# Patient Record
Sex: Female | Born: 1970
Health system: Southern US, Community
[De-identification: ages and names within clinical notes are randomized; demographics above are authoritative.]

## PROBLEM LIST (undated history)

## (undated) DIAGNOSIS — T7840XA Allergy, unspecified, initial encounter: Secondary | ICD-10-CM

## (undated) DIAGNOSIS — G43909 Migraine, unspecified, not intractable, without status migrainosus: Secondary | ICD-10-CM

## (undated) DIAGNOSIS — L570 Actinic keratosis: Secondary | ICD-10-CM

## (undated) DIAGNOSIS — M2550 Pain in unspecified joint: Secondary | ICD-10-CM

## (undated) DIAGNOSIS — F419 Anxiety disorder, unspecified: Secondary | ICD-10-CM

## (undated) DIAGNOSIS — F32A Depression, unspecified: Secondary | ICD-10-CM

## (undated) DIAGNOSIS — E785 Hyperlipidemia, unspecified: Secondary | ICD-10-CM

## (undated) DIAGNOSIS — F329 Major depressive disorder, single episode, unspecified: Secondary | ICD-10-CM

## (undated) HISTORY — DX: Migraine, unspecified, not intractable, without status migrainosus: G43.909

## (undated) HISTORY — DX: Anxiety disorder, unspecified: F41.9

## (undated) HISTORY — DX: Depression, unspecified: F32.A

## (undated) HISTORY — PX: TUBAL LIGATION: SHX77

## (undated) HISTORY — DX: Major depressive disorder, single episode, unspecified: F32.9

## (undated) HISTORY — DX: Actinic keratosis: L57.0

## (undated) HISTORY — DX: Hyperlipidemia, unspecified: E78.5

## (undated) HISTORY — DX: Pain in unspecified joint: M25.50

## (undated) HISTORY — DX: Allergy, unspecified, initial encounter: T78.40XA

---

## 2006-05-11 ENCOUNTER — Ambulatory Visit: Payer: Self-pay

## 2008-10-24 ENCOUNTER — Ambulatory Visit: Payer: Self-pay

## 2009-12-06 HISTORY — PX: ENDOMETRIAL ABLATION: SHX621

## 2010-07-29 ENCOUNTER — Ambulatory Visit: Payer: Self-pay

## 2010-08-04 ENCOUNTER — Ambulatory Visit: Payer: Self-pay

## 2010-08-05 LAB — PATHOLOGY REPORT

## 2013-02-22 ENCOUNTER — Ambulatory Visit: Payer: Self-pay

## 2017-03-22 MED FILL — BUPROPION SR 150 MG TABLET: 150 | 30 days supply | Qty: 30 | Fill #0

## 2017-04-27 MED FILL — BUPROPION SR 150 MG TABLET: 150 | 30 days supply | Qty: 30 | Fill #1

## 2017-06-02 DIAGNOSIS — F4323 Adjustment disorder with mixed anxiety and depressed mood: Secondary | ICD-10-CM | POA: Diagnosis not present

## 2017-06-02 MED FILL — traZODone HCL 50 MG TABS: 50 | 90 days supply | Qty: 90 | Fill #0

## 2017-08-29 DIAGNOSIS — L239 Allergic contact dermatitis, unspecified cause: Secondary | ICD-10-CM | POA: Diagnosis not present

## 2017-08-31 DIAGNOSIS — L239 Allergic contact dermatitis, unspecified cause: Secondary | ICD-10-CM | POA: Diagnosis not present

## 2017-09-05 DIAGNOSIS — L239 Allergic contact dermatitis, unspecified cause: Secondary | ICD-10-CM | POA: Diagnosis not present

## 2017-09-19 ENCOUNTER — Ambulatory Visit: Payer: Self-pay | Admitting: Internal Medicine

## 2017-09-29 ENCOUNTER — Encounter: Payer: Self-pay | Admitting: Internal Medicine

## 2017-09-29 ENCOUNTER — Ambulatory Visit (INDEPENDENT_AMBULATORY_CARE_PROVIDER_SITE_OTHER): Payer: 59 | Admitting: Internal Medicine

## 2017-09-29 VITALS — BP 124/72 | HR 80 | Temp 98.5°F | Resp 14 | Ht 62.25 in | Wt 174.4 lb

## 2017-09-29 DIAGNOSIS — F330 Major depressive disorder, recurrent, mild: Secondary | ICD-10-CM

## 2017-09-29 DIAGNOSIS — H52223 Regular astigmatism, bilateral: Secondary | ICD-10-CM | POA: Diagnosis not present

## 2017-09-29 DIAGNOSIS — Z131 Encounter for screening for diabetes mellitus: Secondary | ICD-10-CM | POA: Diagnosis not present

## 2017-09-29 DIAGNOSIS — R5383 Other fatigue: Secondary | ICD-10-CM

## 2017-09-29 DIAGNOSIS — Z0000 Encounter for general adult medical examination without abnormal findings: Secondary | ICD-10-CM

## 2017-09-29 DIAGNOSIS — E669 Obesity, unspecified: Secondary | ICD-10-CM

## 2017-09-29 DIAGNOSIS — Z13220 Encounter for screening for lipoid disorders: Secondary | ICD-10-CM

## 2017-09-29 MED ORDER — BUPROPION HCL ER (SR) 100 MG PO TB12: 100.0000 mg | ORAL_TABLET | Freq: Two times a day (BID) | ORAL | 0 refills | Status: DC

## 2017-09-29 MED ORDER — BUPROPION HCL ER (XL) 150 MG PO TB24: 150.0000 mg | ORAL_TABLET | Freq: Every day | ORAL | 5 refills | Status: DC

## 2017-09-29 MED ORDER — TRAZODONE HCL 50 MG PO TABS: 25.0000 mg | ORAL_TABLET | Freq: Every evening | ORAL | 3 refills | Status: DC | PRN

## 2017-09-29 MED FILL — BUPROPION HCL SR 100 MG TAB: 100 | 30 days supply | Qty: 60 | Fill #0

## 2017-09-29 NOTE — Progress Notes (Signed)
Subjective:  Patient ID: Heather Pratt, female    DOB: [birth year]  Age: 46 y.o. MRN: [id number]  CC: The primary encounter diagnosis was Fatigue, unspecified type. Diagnoses of Screening for hyperlipidemia, Screening for diabetes mellitus, Mild episode of recurrent major depressive disorder ([place]), Obesity (BMI 30.0-34.9), and Encounter for preventive health examination were also pertinent to this visit.  HPI Heather Pratt presents for new patient evaluation and need for need for CPE . Referred by her mother Elmer Bales   Finished doctoral degree in PT  Working for [employer] in [place] .  Loves it    History of Major Depressive Disorder  diagnosed 10 yrs ago when her  father died. Thinks she has been depressed since high school but never acknowledged it.  Has a husband and two teenage boys,  Both in sports.  Works full time,  Husband shares the cooking .  Notes fatigue, Little energy after cooking  to do anything  Since she stopped medication in July .  Does more on the weekends, yoga and walking     History Donyel has a past medical history of Allergy and Depression.    History of c sections x 2 .  No prediabetes. Having regular periods ,  Tubal ligation.   Endometrial ablation in 2011 for menorrhagia    She has a past surgical history that includes Cesarean section with bilateral tubal ligation (005) and Endometrial ablation (2011).   Her family history includes Alcohol abuse in her father; Heart disease in her maternal grandfather and maternal grandmother; Hypertension in her father, maternal grandfather, maternal grandmother, and mother; Prostate cancer in her maternal grandfather.She reports that she has never smoked. She has never used smokeless tobacco. She reports that she drinks about 1.2 oz of alcohol per week . She reports that she does not use drugs.  No outpatient prescriptions prior to visit.   No facility-administered medications prior to visit.      Review of Systems:  Patient denies headache, fevers, malaise, unintentional weight loss, skin rash, eye pain, sinus congestion and sinus pain, sore throat, dysphagia,  hemoptysis , cough, dyspnea, wheezing, chest pain, palpitations, orthopnea, edema, abdominal pain, nausea, melena, diarrhea, constipation, flank pain, dysuria, hematuria, urinary  Frequency, nocturia, numbness, tingling, seizures,  Focal weakness, Loss of consciousness,  Tremor, insomnia, depression, anxiety, and suicidal ideation.     Objective:  BP 124/72 (BP Location: Left Arm, Patient Position: Sitting, Cuff Size: Normal)   Pulse 80   Temp 98.5 F (36.9 C) (Oral)   Resp 14   Ht 5' 2.25" (1.581 m)   Wt 174 lb 6.4 oz (79.1 kg)   SpO2 99%   BMI 31.64 kg/m   Physical Exam:  General appearance: alert, cooperative and appears stated age Ears: normal TM's and external ear canals both ears Throat: lips, mucosa, and tongue normal; teeth and gums normal Neck: no adenopathy, no carotid bruit, supple, symmetrical, trachea midline and thyroid not enlarged, symmetric, no tenderness/mass/nodules Back: symmetric, no curvature. ROM normal. No CVA tenderness. Lungs: clear to auscultation bilaterally Heart: regular rate and rhythm, S1, S2 normal, no murmur, click, rub or gallop Abdomen: soft, non-tender; bowel sounds normal; no masses,  no organomegaly Pulses: 2+ and symmetric Skin: Skin color, texture, turgor normal. No rashes or lesions Lymph nodes: Cervical, supraclavicular, and axillary nodes normal. Psych: affect normal, makes good eye contact. No fidgeting,  Smiles easily.  Denies suicidal thoughts   Assessment & Plan:   Problem  List Items Addressed This Visit    Encounter for preventive health examination    Annual comprehensive preventive exam was done as well as an evaluation and management of chronic conditions .  During the course of the visit the patient was educated and counseled about appropriate screening and  preventive services including :  diabetes screening, lipid analysis with projected  10 year  risk for CAD , nutrition counseling, breast, cervical and colorectal cancer screening, and recommended immunizations.  Printed recommendations for health maintenance screenings was given      Fatigue - Primary    Screening labs normal.  No history of snoring.  Recommended resuming wellbutrin , trial of trazodone for insomnia, and  participating in regular exercise program with goal of achieving a minimum of 30 minutes of aerobic activity 5 days per week.   Lab Results  Component Value Date   TSH 1.53 09/30/2017   Lab Results  Component Value Date   WBC 6.4 09/30/2017   HGB 13.4 09/30/2017   HCT 40.9 09/30/2017   MCV 92.3 09/30/2017   PLT 230.0 09/30/2017   Lab Results  Component Value Date   CREATININE 0.67 09/30/2017   Lab Results  Component Value Date   ALT 8 09/30/2017   AST 13 09/30/2017   ALKPHOS 39 09/30/2017   BILITOT 0.4 09/30/2017         Relevant Orders   Comprehensive metabolic panel (Completed)   TSH (Completed)   CBC with Differential/Platelet (Completed)   Major depressive disorder with current active episode    With insomnia, fatigue and lack of motivation scited as most pervasive symptoms, which returned after she stopped her wellbutrin in July .  Resume wellbutrin, trial of trazodone.        Relevant Medications   buPROPion (WELLBUTRIN SR) 100 MG 12 hr tablet   traZODone (DESYREL) 50 MG tablet   buPROPion (WELLBUTRIN XL) 150 MG 24 hr tablet   Obesity (BMI 30.0-34.9)    I have addressed  BMI and recommended wt loss of 10% of body weight over the next 6 months using a low fat, low starch, high protein  fruit/vegetable based Mediterranean diet and 30 minutes of aerobic exercise a minimum of 5 days per week.  Screening for diabetes, hyperlipidemia, and hypothyroidism today   All negative   Lab Results  Component Value Date   HGBA1C 5.5 09/30/2017   Lab Results    Component Value Date   CHOL 179 09/30/2017   HDL 51.00 09/30/2017   LDLCALC 111 (H) 09/30/2017   TRIG 84.0 09/30/2017   CHOLHDL 4 09/30/2017   Lab Results  Component Value Date   TSH 1.53 09/30/2017           Other Visit Diagnoses    Screening for hyperlipidemia       Relevant Orders   Lipid panel (Completed)   Screening for diabetes mellitus       Relevant Orders   Hemoglobin A1c (Completed)      I am having [employer] start on buPROPion, traZODone, and buPROPion.  Meds ordered this encounter  Medications  . buPROPion (WELLBUTRIN SR) 100 MG 12 hr tablet    Sig: Take 1 tablet (100 mg total) by mouth 2 (two) times daily.    Dispense:  60 tablet    Refill:  0  . traZODone (DESYREL) 50 MG tablet    Sig: Take 0.5-1 tablets (25-50 mg total) by mouth at bedtime as needed for sleep.  Dispense:  90 tablet    Refill:  3  . buPROPion (WELLBUTRIN XL) 150 MG 24 hr tablet    Sig: Take 1 tablet (150 mg total) by mouth daily.    Dispense:  30 tablet    Refill:  5    There are no discontinued medications.  Follow-up: Return in about 3 months (around 12/30/2017) for fasting labs asap .   Crecencio Mc, MD

## 2017-09-29 NOTE — Patient Instructions (Addendum)
Welcome!  So  Glad to meet you!  I agree  With resuming wellbutrin and trazodone (if needed)  Start wellbutrin with the 100 mg twice daily dose (2nd dose by 3 pm);  We will change to 150 mg once daily after the first month  (save the rx I gave you)    Health Maintenance, Female Adopting a healthy lifestyle and getting preventive care can go a long way to promote health and wellness. Talk with your health care provider about what schedule of regular examinations is right for you. This is a good chance for you to check in with your provider about disease prevention and staying healthy. In between checkups, there are plenty of things you can do on your own. Experts have done a lot of research about which lifestyle changes and preventive measures are most likely to keep you healthy. Ask your health care provider for more information. Weight and diet Eat a healthy diet  Be sure to include plenty of vegetables, fruits, low-fat dairy products, and lean protein.  Do not eat a lot of foods high in solid fats, added sugars, or salt.  Get regular exercise. This is one of the most important things you can do for your health. ? Most adults should exercise for at least 150 minutes each week. The exercise should increase your heart rate and make you sweat (moderate-intensity exercise). ? Most adults should also do strengthening exercises at least twice a week. This is in addition to the moderate-intensity exercise.  Maintain a healthy weight  Body mass index (BMI) is a measurement that can be used to identify possible weight problems. It estimates body fat based on height and weight. Your health care provider can help determine your BMI and help you achieve or maintain a healthy weight.  For females 71 years of age and older: ? A BMI below 18.5 is considered underweight. ? A BMI of 18.5 to 24.9 is normal. ? A BMI of 25 to 29.9 is considered overweight. ? A BMI of 30 and above is considered  obese.  Watch levels of cholesterol and blood lipids  You should start having your blood tested for lipids and cholesterol at 45 years of age, then have this test every 5 years.  You may need to have your cholesterol levels checked more often if: ? Your lipid or cholesterol levels are high. ? You are older than 45 years of age. ? You are at high risk for heart disease.  Cancer screening Lung Cancer  Lung cancer screening is recommended for adults 55-31 years old who are at high risk for lung cancer because of a history of smoking.  A yearly low-dose CT scan of the lungs is recommended for people who: ? Currently smoke. ? Have quit within the past 15 years. ? Have at least a 30-pack-year history of smoking. A pack year is smoking an average of one pack of cigarettes a day for 1 year.  Yearly screening should continue until it has been 15 years since you quit.  Yearly screening should stop if you develop a health problem that would prevent you from having lung cancer treatment.  Breast Cancer  Practice breast self-awareness. This means understanding how your breasts normally appear and feel.  It also means doing regular breast self-exams. Let your health care provider know about any changes, no matter how small.  If you are in your 20s or 30s, you should have a clinical breast exam (CBE) by a health care provider  every 1-3 years as part of a regular health exam.  If you are 41 or older, have a CBE every year. Also consider having a breast X-ray (mammogram) every year.  If you have a family history of breast cancer, talk to your health care provider about genetic screening.  If you are at high risk for breast cancer, talk to your health care provider about having an MRI and a mammogram every year.  Breast cancer gene (BRCA) assessment is recommended for women who have family members with BRCA-related cancers. BRCA-related cancers  include: ? Breast. ? Ovarian. ? Tubal. ? Peritoneal cancers.  Results of the assessment will determine the need for genetic counseling and BRCA1 and BRCA2 testing.  Cervical Cancer Your health care provider may recommend that you be screened regularly for cancer of the pelvic organs (ovaries, uterus, and vagina). This screening involves a pelvic examination, including checking for microscopic changes to the surface of your cervix (Pap test). You may be encouraged to have this screening done every 3 years, beginning at age 65.  For women ages 44-65, health care providers may recommend pelvic exams and Pap testing every 3 years, or they may recommend the Pap and pelvic exam, combined with testing for human papilloma virus (HPV), every 5 years. Some types of HPV increase your risk of cervical cancer. Testing for HPV may also be done on women of any age with unclear Pap test results.  Other health care providers may not recommend any screening for nonpregnant women who are considered low risk for pelvic cancer and who do not have symptoms. Ask your health care provider if a screening pelvic exam is right for you.  If you have had past treatment for cervical cancer or a condition that could lead to cancer, you need Pap tests and screening for cancer for at least 20 years after your treatment. If Pap tests have been discontinued, your risk factors (such as having a new sexual partner) need to be reassessed to determine if screening should resume. Some women have medical problems that increase the chance of getting cervical cancer. In these cases, your health care provider may recommend more frequent screening and Pap tests.  Colorectal Cancer  This type of cancer can be detected and often prevented.  Routine colorectal cancer screening usually begins at 45 years of age and continues through 45 years of age.  Your health care provider may recommend screening at an earlier age if you have risk factors  for colon cancer.  Your health care provider may also recommend using home test kits to check for hidden blood in the stool.  A small camera at the end of a tube can be used to examine your colon directly (sigmoidoscopy or colonoscopy). This is done to check for the earliest forms of colorectal cancer.  Routine screening usually begins at age 19.  Direct examination of the colon should be repeated every 5-10 years through 45 years of age. However, you may need to be screened more often if early forms of precancerous polyps or small growths are found.  Skin Cancer  Check your skin from head to toe regularly.  Tell your health care provider about any new moles or changes in moles, especially if there is a change in a mole's shape or color.  Also tell your health care provider if you have a mole that is larger than the size of a pencil eraser.  Always use sunscreen. Apply sunscreen liberally and repeatedly throughout the day.  Protect yourself by wearing long sleeves, pants, a wide-brimmed hat, and sunglasses whenever you are outside.  Heart disease, diabetes, and high blood pressure  High blood pressure causes heart disease and increases the risk of stroke. High blood pressure is more likely to develop in: ? People who have blood pressure in the high end of the normal range (130-139/85-89 mm Hg). ? People who are overweight or obese. ? People who are African American.  If you are 57-39 years of age, have your blood pressure checked every 3-5 years. If you are 19 years of age or older, have your blood pressure checked every year. You should have your blood pressure measured twice-once when you are at a hospital or clinic, and once when you are not at a hospital or clinic. Record the average of the two measurements. To check your blood pressure when you are not at a hospital or clinic, you can use: ? An automated blood pressure machine at a pharmacy. ? A home blood pressure monitor.  If  you are between 19 years and 38 years old, ask your health care provider if you should take aspirin to prevent strokes.  Have regular diabetes screenings. This involves taking a blood sample to check your fasting blood sugar level. ? If you are at a normal weight and have a low risk for diabetes, have this test once every three years after 45 years of age. ? If you are overweight and have a high risk for diabetes, consider being tested at a younger age or more often. Preventing infection Hepatitis B  If you have a higher risk for hepatitis B, you should be screened for this virus. You are considered at high risk for hepatitis B if: ? You were born in a country where hepatitis B is common. Ask your health care provider which countries are considered high risk. ? Your parents were born in a high-risk country, and you have not been immunized against hepatitis B (hepatitis B vaccine). ? You have HIV or AIDS. ? You use needles to inject street drugs. ? You live with someone who has hepatitis B. ? You have had sex with someone who has hepatitis B. ? You get hemodialysis treatment. ? You take certain medicines for conditions, including cancer, organ transplantation, and autoimmune conditions.  Hepatitis C  Blood testing is recommended for: ? Everyone born from [birth year] through 1964. ? Anyone with known risk factors for hepatitis C.  Sexually transmitted infections (STIs)  You should be screened for sexually transmitted infections (STIs) including gonorrhea and chlamydia if: ? You are sexually active and are younger than 45 years of age. ? You are older than 45 years of age and your health care provider tells you that you are at risk for this type of infection. ? Your sexual activity has changed since you were last screened and you are at an increased risk for chlamydia or gonorrhea. Ask your health care provider if you are at risk.  If you do not have HIV, but are at risk, it may be recommended  that you take a prescription medicine daily to prevent HIV infection. This is called pre-exposure prophylaxis (PrEP). You are considered at risk if: ? You are sexually active and do not regularly use condoms or know the HIV status of your partner(s). ? You take drugs by injection. ? You are sexually active with a partner who has HIV.  Talk with your health care provider about whether you are at high risk of  being infected with HIV. If you choose to begin PrEP, you should first be tested for HIV. You should then be tested every 3 months for as long as you are taking PrEP. Pregnancy  If you are premenopausal and you may become pregnant, ask your health care provider about preconception counseling.  If you may become pregnant, take 400 to 800 micrograms (mcg) of folic acid every day.  If you want to prevent pregnancy, talk to your health care provider about birth control (contraception). Osteoporosis and menopause  Osteoporosis is a disease in which the bones lose minerals and strength with aging. This can result in serious bone fractures. Your risk for osteoporosis can be identified using a bone density scan.  If you are 63 years of age or older, or if you are at risk for osteoporosis and fractures, ask your health care provider if you should be screened.  Ask your health care provider whether you should take a calcium or vitamin D supplement to lower your risk for osteoporosis.  Menopause may have certain physical symptoms and risks.  Hormone replacement therapy may reduce some of these symptoms and risks. Talk to your health care provider about whether hormone replacement therapy is right for you. Follow these instructions at home:  Schedule regular health, dental, and eye exams.  Stay current with your immunizations.  Do not use any tobacco products including cigarettes, chewing tobacco, or electronic cigarettes.  If you are pregnant, do not drink alcohol.  If you are  breastfeeding, limit how much and how often you drink alcohol.  Limit alcohol intake to no more than 1 drink per day for nonpregnant women. One drink equals 12 ounces of beer, 5 ounces of wine, or 1 ounces of hard liquor.  Do not use street drugs.  Do not share needles.  Ask your health care provider for help if you need support or information about quitting drugs.  Tell your health care provider if you often feel depressed.  Tell your health care provider if you have ever been abused or do not feel safe at home. This information is not intended to replace advice given to you by your health care provider. Make sure you discuss any questions you have with your health care provider. Document Released: 06/07/2011 Document Revised: 04/29/2016 Document Reviewed: 08/26/2015 Elsevier Interactive Patient Education  [employer].

## 2017-09-30 ENCOUNTER — Other Ambulatory Visit (INDEPENDENT_AMBULATORY_CARE_PROVIDER_SITE_OTHER): Payer: 59

## 2017-09-30 DIAGNOSIS — Z131 Encounter for screening for diabetes mellitus: Secondary | ICD-10-CM

## 2017-09-30 DIAGNOSIS — Z13220 Encounter for screening for lipoid disorders: Secondary | ICD-10-CM | POA: Diagnosis not present

## 2017-09-30 DIAGNOSIS — R5383 Other fatigue: Secondary | ICD-10-CM | POA: Diagnosis not present

## 2017-09-30 LAB — COMPREHENSIVE METABOLIC PANEL
ALK PHOS: 39 U/L (ref 39–117)
ALT: 8 U/L (ref 0–35)
AST: 13 U/L (ref 0–37)
Albumin: 3.8 g/dL (ref 3.5–5.2)
BILIRUBIN TOTAL: 0.4 mg/dL (ref 0.2–1.2)
BUN: 13 mg/dL (ref 6–23)
CO2: 26 meq/L (ref 19–32)
CREATININE: 0.67 mg/dL (ref 0.40–1.20)
Calcium: 9 mg/dL (ref 8.4–10.5)
Chloride: 107 mEq/L (ref 96–112)
GFR: 100.94 mL/min (ref >60.00)
GLUCOSE: 93 mg/dL (ref 70–99)
Potassium: 4.2 mEq/L (ref 3.5–5.1)
Sodium: 138 mEq/L (ref 135–145)
TOTAL PROTEIN: 6.9 g/dL (ref 6.0–8.3)

## 2017-09-30 LAB — HEMOGLOBIN A1C: HEMOGLOBIN A1C: 5.5 % (ref 4.6–6.5)

## 2017-09-30 LAB — LIPID PANEL
CHOLESTEROL: 179 mg/dL (ref 0–200)
HDL: 51 mg/dL (ref >39.00)
LDL Cholesterol: 111 mg/dL — ABNORMAL HIGH (ref 0–99)
NONHDL: 127.82
Total CHOL/HDL Ratio: 4
Triglycerides: 84 mg/dL (ref 0.0–149.0)
VLDL: 16.8 mg/dL (ref 0.0–40.0)

## 2017-09-30 LAB — CBC WITH DIFFERENTIAL/PLATELET
BASOS PCT: 0.8 % (ref 0.0–3.0)
Basophils Absolute: 0.1 10*3/uL (ref 0.0–0.1)
Eosinophils Absolute: 0.3 10*3/uL (ref 0.0–0.7)
Eosinophils Relative: 5.1 % — ABNORMAL HIGH (ref 0.0–5.0)
HEMATOCRIT: 40.9 % (ref 36.0–46.0)
HEMOGLOBIN: 13.4 g/dL (ref 12.0–15.0)
LYMPHS ABS: 2 10*3/uL (ref 0.7–4.0)
Lymphocytes Relative: 30.4 % (ref 12.0–46.0)
MCHC: 32.7 g/dL (ref 30.0–36.0)
MCV: 92.3 fl (ref 78.0–100.0)
MONO ABS: 0.6 10*3/uL (ref 0.1–1.0)
Monocytes Relative: 9.6 % (ref 3.0–12.0)
NEUTROS ABS: 3.5 10*3/uL (ref 1.4–7.7)
Neutrophils Relative %: 54.1 % (ref 43.0–77.0)
Platelets: 230 10*3/uL (ref 150.0–400.0)
RBC: 4.44 Mil/uL (ref 3.87–5.11)
RDW: 13.7 % (ref 11.5–15.5)
WBC: 6.4 10*3/uL (ref 4.0–10.5)

## 2017-09-30 LAB — TSH: TSH: 1.53 u[IU]/mL (ref 0.35–4.50)

## 2017-10-01 DIAGNOSIS — F329 Major depressive disorder, single episode, unspecified: Secondary | ICD-10-CM | POA: Insufficient documentation

## 2017-10-01 DIAGNOSIS — R5383 Other fatigue: Secondary | ICD-10-CM | POA: Insufficient documentation

## 2017-10-01 DIAGNOSIS — E669 Obesity, unspecified: Secondary | ICD-10-CM | POA: Insufficient documentation

## 2017-10-01 DIAGNOSIS — Z0000 Encounter for general adult medical examination without abnormal findings: Secondary | ICD-10-CM | POA: Insufficient documentation

## 2017-10-01 NOTE — Assessment & Plan Note (Signed)
Annual comprehensive preventive exam was done as well as an evaluation and management of chronic conditions .  During the course of the visit the patient was educated and counseled about appropriate screening and preventive services including :  diabetes screening, lipid analysis with projected  10 year  risk for CAD , nutrition counseling, breast, cervical and colorectal cancer screening, and recommended immunizations.  Printed recommendations for health maintenance screenings was given

## 2017-10-01 NOTE — Assessment & Plan Note (Addendum)
With insomnia, fatigue and lack of motivation scited as most pervasive symptoms, which returned after she stopped her wellbutrin in July .  Resume wellbutrin, trial of trazodone.

## 2017-10-01 NOTE — Assessment & Plan Note (Signed)
Screening labs normal.  No history of snoring.  Recommended resuming wellbutrin , trial of trazodone for insomnia, and  participating in regular exercise program with goal of achieving a minimum of 30 minutes of aerobic activity 5 days per week.   Lab Results  Component Value Date   TSH 1.53 09/30/2017   Lab Results  Component Value Date   WBC 6.4 09/30/2017   HGB 13.4 09/30/2017   HCT 40.9 09/30/2017   MCV 92.3 09/30/2017   PLT 230.0 09/30/2017   Lab Results  Component Value Date   CREATININE 0.67 09/30/2017   Lab Results  Component Value Date   ALT 8 09/30/2017   AST 13 09/30/2017   ALKPHOS 39 09/30/2017   BILITOT 0.4 09/30/2017

## 2017-10-01 NOTE — Assessment & Plan Note (Signed)
I have addressed  BMI and recommended wt loss of 10% of body weight over the next 6 months using a low fat, low starch, high protein  fruit/vegetable based Mediterranean diet and 30 minutes of aerobic exercise a minimum of 5 days per week.  Screening for diabetes, hyperlipidemia, and hypothyroidism today   All negative   Lab Results  Component Value Date   HGBA1C 5.5 09/30/2017   Lab Results  Component Value Date   CHOL 179 09/30/2017   HDL 51.00 09/30/2017   LDLCALC 111 (H) 09/30/2017   TRIG 84.0 09/30/2017   CHOLHDL 4 09/30/2017   Lab Results  Component Value Date   TSH 1.53 09/30/2017

## 2017-10-18 DIAGNOSIS — Z1231 Encounter for screening mammogram for malignant neoplasm of breast: Secondary | ICD-10-CM | POA: Diagnosis not present

## 2017-10-18 DIAGNOSIS — Z6831 Body mass index (BMI) 31.0-31.9, adult: Secondary | ICD-10-CM | POA: Diagnosis not present

## 2017-10-18 DIAGNOSIS — Z1151 Encounter for screening for human papillomavirus (HPV): Secondary | ICD-10-CM | POA: Diagnosis not present

## 2017-10-18 DIAGNOSIS — Z01419 Encounter for gynecological examination (general) (routine) without abnormal findings: Secondary | ICD-10-CM | POA: Diagnosis not present

## 2017-10-18 LAB — HM PAP SMEAR: HM PAP: NEGATIVE

## 2017-10-25 MED FILL — BUPROPION HCL XL 150 MG TAB: 150 | 30 days supply | Qty: 30 | Fill #0

## 2017-11-24 MED FILL — BUPROPION HCL XL 150 MG TAB: 150 | 30 days supply | Qty: 30 | Fill #1

## 2017-12-28 MED FILL — buPROPion HCL ER (XL) 150 M: 150 | 30 days supply | Qty: 30 | Fill #2

## 2017-12-30 ENCOUNTER — Ambulatory Visit: Payer: 59 | Admitting: Internal Medicine

## 2018-01-02 ENCOUNTER — Encounter: Payer: Self-pay | Admitting: Internal Medicine

## 2018-01-02 ENCOUNTER — Ambulatory Visit (INDEPENDENT_AMBULATORY_CARE_PROVIDER_SITE_OTHER): Payer: 59 | Admitting: Internal Medicine

## 2018-01-02 DIAGNOSIS — F330 Major depressive disorder, recurrent, mild: Secondary | ICD-10-CM

## 2018-01-02 MED ORDER — OSELTAMIVIR PHOSPHATE 75 MG PO CAPS: 75.0000 mg | ORAL_CAPSULE | Freq: Every day | ORAL | 0 refills | Status: DC

## 2018-01-02 MED ORDER — BUPROPION HCL ER (XL) 300 MG PO TB24: 300.0000 mg | ORAL_TABLET | Freq: Every day | ORAL | 2 refills | Status: DC

## 2018-01-02 NOTE — Progress Notes (Signed)
Subjective:  Patient ID: Heather Pratt, female    DOB: [birth year]  Age: 47 y.o. MRN: [id number]  CC: The encounter diagnosis was Mild episode of recurrent major depressive disorder ([place]).  HPI Heather Pratt presents for follow up on fatigue attibuted to depression .  wellbutrin and trazodone prescRibed,   Exercise recommended   Medication is helping but still averaging one episode  Of despair  about every 2 weeks.  Energy level has improved. She has enough energy to start going for walks   DAILY  Her beloved dog of 12 years died over the holidays   Outpatient Medications Prior to Visit  Medication Sig Dispense Refill  . traZODone (DESYREL) 50 MG tablet Take 0.5-1 tablets (25-50 mg total) by mouth at bedtime as needed for sleep. 90 tablet 3  . buPROPion (WELLBUTRIN XL) 150 MG 24 hr tablet Take 1 tablet (150 mg total) by mouth daily. 30 tablet 5  . buPROPion (WELLBUTRIN SR) 100 MG 12 hr tablet Take 1 tablet (100 mg total) by mouth 2 (two) times daily. (Patient not taking: Reported on 01/02/2018) 60 tablet 0   No facility-administered medications prior to visit.     Review of Systems;  Patient denies headache, fevers, malaise, unintentional weight loss, skin rash, eye pain, sinus congestion and sinus pain, sore throat, dysphagia,  hemoptysis , cough, dyspnea, wheezing, chest pain, palpitations, orthopnea, edema, abdominal pain, nausea, melena, diarrhea, constipation, flank pain, dysuria, hematuria, urinary  Frequency, nocturia, numbness, tingling, seizures,  Focal weakness, Loss of consciousness,  Tremor, insomnia, depression, anxiety, and suicidal ideation.      Objective:  BP 126/70 (BP Location: Left Arm, Patient Position: Sitting, Cuff Size: Normal)   Pulse 78   Temp 97.9 F (36.6 C) (Oral)   Resp 15   Ht 5' 2.25" (1.581 m)   Wt 177 lb 9.6 oz (80.6 kg)   SpO2 98%   BMI 32.22 kg/m   BP Readings from Last 3 Encounters:  01/02/18 126/70  09/29/17 124/72     Wt Readings from Last 3 Encounters:  01/02/18 177 lb 9.6 oz (80.6 kg)  09/29/17 174 lb 6.4 oz (79.1 kg)    General appearance: alert, cooperative and appears stated age Ears: normal TM's and external ear canals both ears Throat: lips, mucosa, and tongue normal; teeth and gums normal Neck: no adenopathy, no carotid bruit, supple, symmetrical, trachea midline and thyroid not enlarged, symmetric, no tenderness/mass/nodules Back: symmetric, no curvature. ROM normal. No CVA tenderness. Lungs: clear to auscultation bilaterally Heart: regular rate and rhythm, S1, S2 normal, no murmur, click, rub or gallop Abdomen: soft, non-tender; bowel sounds normal; no masses,  no organomegaly Pulses: 2+ and symmetric Skin: Skin color, texture, turgor normal. No rashes or lesions Lymph nodes: Cervical, supraclavicular, and axillary nodes normal.  Lab Results  Component Value Date   HGBA1C 5.5 09/30/2017    Lab Results  Component Value Date   CREATININE 0.67 09/30/2017    Lab Results  Component Value Date   WBC 6.4 09/30/2017   HGB 13.4 09/30/2017   HCT 40.9 09/30/2017   PLT 230.0 09/30/2017   GLUCOSE 93 09/30/2017   CHOL 179 09/30/2017   TRIG 84.0 09/30/2017   HDL 51.00 09/30/2017   LDLCALC 111 (H) 09/30/2017   ALT 8 09/30/2017   AST 13 09/30/2017   NA 138 09/30/2017   K 4.2 09/30/2017   CL 107 09/30/2017   CREATININE 0.67 09/30/2017   BUN 13 09/30/2017   CO2  26 09/30/2017   TSH 1.53 09/30/2017   HGBA1C 5.5 09/30/2017    Assessment & Plan:   Problem List Items Addressed This Visit    Major depressive disorder with current active episode    Symptoms not currently adequately controlled on curent dose of wellbutrin .  Increasing wellbutrin to 300 mg daily .        Relevant Medications   buPROPion (WELLBUTRIN XL) 300 MG 24 hr tablet    A total of 21mnutes of face to face time was spent with patient more than half of which was spent in counselling and coordination of care    I am having Avyn B. VWhole Foodsstart on oseltamivir. I am also having her maintain her traZODone and buPROPion.  Meds ordered this encounter  Medications  . DISCONTD: buPROPion (WELLBUTRIN XL) 300 MG 24 hr tablet    Sig: Take 1 tablet (300 mg total) by mouth daily.    Dispense:  90 tablet    Refill:  2  . oseltamivir (TAMIFLU) 75 MG capsule    Sig: Take 1 capsule (75 mg total) by mouth daily.    Dispense:  10 capsule    Refill:  0    KEEP ON FILE FOR FUTURE USE  . buPROPion (WELLBUTRIN XL) 300 MG 24 hr tablet    Sig: Take 1 tablet (300 mg total) by mouth daily.    Dispense:  90 tablet    Refill:  2    Medications Discontinued During This Encounter  Medication Reason  . buPROPion (WELLBUTRIN SR) 100 MG 12 hr tablet   . buPROPion (WELLBUTRIN XL) 150 MG 24 hr tablet   . buPROPion (WELLBUTRIN XL) 300 MG 24 hr tablet Reorder    Follow-up: Return in about 6 months (around 07/02/2018) for depression.   TCrecencio Mc MD

## 2018-01-02 NOTE — Patient Instructions (Signed)
WE INCREASED THE WELLBUTRIN DOSE TO 300 MG DAILY  IF YOU FEEL IT IS TOO STRONG,  WE CAN REDUCE THE DOSE AND ADD A 10 MG DOSE OF LEXAPRO  USE YOUR MYCHART TO LET ME KNOW HOW YOU ARE DOING IN A FEW WEEKS

## 2018-01-03 NOTE — Assessment & Plan Note (Addendum)
Symptoms not currently adequately controlled on curent dose of wellbutrin .  Increasing wellbutrin to 300 mg daily .

## 2018-01-16 MED FILL — BUPROPION HCL XL 300 MG TAB: 300 | 90 days supply | Qty: 90 | Fill #0

## 2018-01-30 MED FILL — CHLORHEXIDINE 0.12% RINSE: 0.12 | 16 days supply | Qty: 473 | Fill #0

## 2018-01-30 MED FILL — AZITHROMYCIN 500 MG TABLET: 500 | 3 days supply | Qty: 3 | Fill #0

## 2018-04-10 MED FILL — BUPROPION HCL XL 300 MG TAB: 300 | 90 days supply | Qty: 90 | Fill #1

## 2018-07-20 MED FILL — BUPROPION HCL XL 300 MG TAB: 300 | 90 days supply | Qty: 90 | Fill #2

## 2018-07-24 MED FILL — AZITHROMYCIN 250 MG TABLET: 250 | 1 days supply | Qty: 2 | Fill #0

## 2018-09-12 ENCOUNTER — Telehealth: Payer: Self-pay
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
## 2018-09-13 ENCOUNTER — Encounter: Payer: Self-pay | Admitting: Internal Medicine

## 2018-09-13 ENCOUNTER — Ambulatory Visit (INDEPENDENT_AMBULATORY_CARE_PROVIDER_SITE_OTHER): Payer: 59 | Admitting: Internal Medicine

## 2018-09-13 VITALS — BP 108/72 | HR 85 | Temp 98.0°F | Resp 15 | Ht 62.25 in | Wt 182.0 lb

## 2018-09-13 DIAGNOSIS — M545 Low back pain, unspecified: Secondary | ICD-10-CM

## 2018-09-13 DIAGNOSIS — M62830 Muscle spasm of back: Secondary | ICD-10-CM | POA: Diagnosis not present

## 2018-09-13 MED ORDER — PANTOPRAZOLE SODIUM 40 MG PO TBEC: 40.0000 mg | DELAYED_RELEASE_TABLET | Freq: Every day | ORAL | 3 refills | Status: DC

## 2018-09-13 MED ORDER — TIZANIDINE HCL 4 MG PO CAPS: 4.0000 mg | ORAL_CAPSULE | Freq: Three times a day (TID) | ORAL | 0 refills | Status: DC | PRN

## 2018-09-13 MED FILL — PANTOPRAZOLE SOD DR 40 MG T: 40 | 30 days supply | Qty: 30 | Fill #0
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
## 2018-09-13 NOTE — Patient Instructions (Signed)
I recommend using ibuprofen 800 mg every 8 hours for the next 5-6 days and ice for 15 minutes every 4 to 6 hours    Pantoprazole once daily for GI protection   You can add up to 2000 mg of acetominophen (tylenol) every day safely  In divided doses (500 mg every 6 hours  Or 1000 mg every 12 hours.)  The zanaflex will be on file at the pharmacy if you need it

## 2018-09-13 NOTE — Telephone Encounter (Signed)
Pt was seen in the office today.

## 2018-09-16 DIAGNOSIS — M62830 Muscle spasm of back: Secondary | ICD-10-CM | POA: Insufficient documentation
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
## 2018-10-17 ENCOUNTER — Other Ambulatory Visit: Payer: Self-pay | Admitting: Internal Medicine

## 2018-10-17 MED FILL — BuPROPion HCL ER (XL) 300 M: 300 | 90 days supply | Qty: 90 | Fill #0

## 2018-10-27 DIAGNOSIS — Z01419 Encounter for gynecological examination (general) (routine) without abnormal findings: Secondary | ICD-10-CM | POA: Diagnosis not present

## 2018-10-27 DIAGNOSIS — Z1151 Encounter for screening for human papillomavirus (HPV): Secondary | ICD-10-CM | POA: Diagnosis not present

## 2018-10-27 DIAGNOSIS — Z1231 Encounter for screening mammogram for malignant neoplasm of breast: Secondary | ICD-10-CM | POA: Diagnosis not present

## 2018-10-27 DIAGNOSIS — Z6832 Body mass index (BMI) 32.0-32.9, adult: Secondary | ICD-10-CM | POA: Diagnosis not present

## 2018-10-27 MED FILL — ESTRADIOL 0.1 MG/DAY PATCH: 0.1 | 84 days supply | Qty: 4 | Fill #0

## 2018-11-24 ENCOUNTER — Encounter: Payer: Self-pay | Admitting: Internal Medicine

## 2018-11-24 ENCOUNTER — Ambulatory Visit (INDEPENDENT_AMBULATORY_CARE_PROVIDER_SITE_OTHER): Payer: 59 | Admitting: Internal Medicine

## 2018-11-24 VITALS — BP 136/82 | HR 79 | Temp 98.1°F | Resp 14 | Ht 62.25 in | Wt 176.4 lb

## 2018-11-24 DIAGNOSIS — E559 Vitamin D deficiency, unspecified: Secondary | ICD-10-CM

## 2018-11-24 DIAGNOSIS — N920 Excessive and frequent menstruation with regular cycle: Secondary | ICD-10-CM

## 2018-11-24 DIAGNOSIS — E66811 Obesity, class 1: Secondary | ICD-10-CM

## 2018-11-24 DIAGNOSIS — F330 Major depressive disorder, recurrent, mild: Secondary | ICD-10-CM

## 2018-11-24 DIAGNOSIS — F418 Other specified anxiety disorders: Secondary | ICD-10-CM

## 2018-11-24 DIAGNOSIS — R5383 Other fatigue: Secondary | ICD-10-CM

## 2018-11-24 DIAGNOSIS — E669 Obesity, unspecified: Secondary | ICD-10-CM

## 2018-11-24 NOTE — Patient Instructions (Signed)
Good to see you!  I will send you your labs results in a few days    I offered you a medication called Buspar,  To help manage short term anxiety .  If you would like to try it,  Just let me know via mychart.  Buspirone tablets What is this medicine? BUSPIRONE (byoo SPYE rone) is used to treat anxiety disorders. This medicine may be used for other purposes; ask your health care provider or pharmacist if you have questions. COMMON BRAND NAME(S): BuSpar What should I tell my health care provider before I take this medicine? They need to know if you have any of these conditions: -kidney or liver disease -an unusual or allergic reaction to buspirone, other medicines, foods, dyes, or preservatives -pregnant or trying to get pregnant -breast-feeding How should I use this medicine? Take this medicine by mouth with a glass of water. Follow the directions on the prescription label. You may take this medicine with or without food. To ensure that this medicine always works the same way for you, you should take it either always with or always without food. Take your doses at regular intervals. Do not take your medicine more often than directed. Do not stop taking except on the advice of your doctor or health care professional. Talk to your pediatrician regarding the use of this medicine in children. Special care may be needed. Overdosage: If you think you have taken too much of this medicine contact a poison control center or emergency room at once. NOTE: This medicine is only for you. Do not share this medicine with others. What if I miss a dose? If you miss a dose, take it as soon as you can. If it is almost time for your next dose, take only that dose. Do not take double or extra doses. What may interact with this medicine? Do not take this medicine with any of the following medications: -linezolid -MAOIs like Carbex, Eldepryl, Marplan, Nardil, and Parnate -methylene blue -procarbazine This  medicine may also interact with the following medications: -diazepam -digoxin -diltiazem -erythromycin -grapefruit juice -haloperidol -medicines for mental depression or mood problems -medicines for seizures like carbamazepine, phenobarbital and phenytoin -nefazodone -other medications for anxiety -rifampin -ritonavir -some antifungal medicines like itraconazole, ketoconazole, and voriconazole -verapamil -warfarin This list may not describe all possible interactions. Give your health care provider a list of all the medicines, herbs, non-prescription drugs, or dietary supplements you use. Also tell them if you smoke, drink alcohol, or use illegal drugs. Some items may interact with your medicine. What should I watch for while using this medicine? Visit your doctor or health care professional for regular checks on your progress. It may take 1 to 2 weeks before your anxiety gets better. You may get drowsy or dizzy. Do not drive, use machinery, or do anything that needs mental alertness until you know how this drug affects you. Do not stand or sit up quickly, especially if you are an older patient. This reduces the risk of dizzy or fainting spells. Alcohol can make you more drowsy and dizzy. Avoid alcoholic drinks. What side effects may I notice from receiving this medicine? Side effects that you should report to your doctor or health care professional as soon as possible: -blurred vision or other vision changes -chest pain -confusion -difficulty breathing -feelings of hostility or anger -muscle aches and pains -numbness or tingling in hands or feet -ringing in the ears -skin rash and itching -vomiting -weakness Side effects that usually do  not require medical attention (report to your doctor or health care professional if they continue or are bothersome): -disturbed dreams, nightmares -headache -nausea -restlessness or nervousness -sore throat and nasal congestion -stomach  upset This list may not describe all possible side effects. Call your doctor for medical advice about side effects. You may report side effects to FDA at 1-800-FDA-1088. Where should I keep my medicine? Keep out of the reach of children. Store at room temperature below 30 degrees C (86 degrees F). Protect from light. Keep container tightly closed. Throw away any unused medicine after the expiration date. NOTE: This sheet is a summary. It may not cover all possible information. If you have questions about this medicine, talk to your doctor, pharmacist, or health care provider.  2019 Elsevier/Gold Standard (2010-07-02 18:06:11)

## 2018-11-24 NOTE — Progress Notes (Signed)
Subjective:  Patient ID: Heather Pratt, female    DOB: May 12, 1971  Age: 47 y.o. MRN: [id number]  CC: The primary encounter diagnosis was Obesity (BMI 30.0-34.9). Diagnoses of Fatigue, unspecified type, Menorrhagia with regular cycle, Vitamin D deficiency, Mild episode of recurrent major depressive disorder ([place]), and Anxiety associated with depression were also pertinent to this visit.  HPI Heather Pratt presents for   Follow up on depression  Emotional stressors  are worse  this time of year.  She is reticent to discuss the source. She is not sleeping well.  trazdone with no side effects  1/2 tablet dose.  She is workng 40+ hours per week    Outpatient Medications Prior to Visit  Medication Sig Dispense Refill  . buPROPion (WELLBUTRIN XL) 300 MG 24 hr tablet TAKE 1 TABLET BY MOUTH DAILY. 90 tablet 2  . traZODone (DESYREL) 50 MG tablet Take 0.5-1 tablets (25-50 mg total) by mouth at bedtime as needed for sleep. 90 tablet 3  . oseltamivir (TAMIFLU) 75 MG capsule Take 1 capsule (75 mg total) by mouth daily. (Patient not taking: Reported on 09/13/2018) 10 capsule 0  . pantoprazole (PROTONIX) 40 MG tablet Take 1 tablet (40 mg total) by mouth daily. (Patient not taking: Reported on 11/24/2018) 30 tablet 3  . tiZANidine (ZANAFLEX) 4 MG capsule Take 1 capsule (4 mg total) by mouth 3 (three) times daily as needed for muscle spasms. (Patient not taking: Reported on 11/24/2018) 30 capsule 0   No facility-administered medications prior to visit.     Review of Systems;  Patient denies headache, fevers, malaise, unintentional weight loss, skin rash, eye pain, sinus congestion and sinus pain, sore throat, dysphagia,  hemoptysis , cough, dyspnea, wheezing, chest pain, palpitations, orthopnea, edema, abdominal pain, nausea, melena, diarrhea, constipation, flank pain, dysuria, hematuria, urinary  Frequency, nocturia, numbness, tingling, seizures,  Focal weakness, Loss of consciousness,   Tremor, insomnia, depression, anxiety, and suicidal ideation.      Objective:  BP 136/82 (BP Location: Left Arm, Patient Position: Sitting, Cuff Size: Normal)   Pulse 79   Temp 98.1 F (36.7 C) (Oral)   Resp 14   Ht 5' 2.25" (1.581 m)   Wt 176 lb 6.4 oz (80 kg)   SpO2 98%   BMI 32.01 kg/m   BP Readings from Last 3 Encounters:  11/24/18 136/82  09/13/18 108/72  01/02/18 126/70    Wt Readings from Last 3 Encounters:  11/24/18 176 lb 6.4 oz (80 kg)  09/13/18 182 lb (82.6 kg)  01/02/18 177 lb 9.6 oz (80.6 kg)    General appearance: alert, cooperative and appears stated age Ears: normal TM's and external ear canals both ears Throat: lips, mucosa, and tongue normal; teeth and gums normal Neck: no adenopathy, no carotid bruit, supple, symmetrical, trachea midline and thyroid not enlarged, symmetric, no tenderness/mass/nodules Back: symmetric, no curvature. ROM normal. No CVA tenderness. Lungs: clear to auscultation bilaterally Heart: regular rate and rhythm, S1, S2 normal, no murmur, click, rub or gallop Abdomen: soft, non-tender; bowel sounds normal; no masses,  no organomegaly Pulses: 2+ and symmetric Skin: Skin color, texture, turgor normal. No rashes or lesions Lymph nodes: Cervical, supraclavicular, and axillary nodes normal.  Lab Results  Component Value Date   HGBA1C 5.1 11/24/2018   HGBA1C 5.5 09/30/2017    Lab Results  Component Value Date   CREATININE 0.75 11/24/2018   CREATININE 0.67 09/30/2017    Lab Results  Component Value Date   WBC  7.6 11/24/2018   HGB 13.2 11/24/2018   HCT 37.2 11/24/2018   PLT 271 11/24/2018   GLUCOSE 80 11/24/2018   CHOL 179 09/30/2017   TRIG 84.0 09/30/2017   HDL 51.00 09/30/2017   LDLCALC 111 (H) 09/30/2017   ALT 11 11/24/2018   AST 16 11/24/2018   NA 137 11/24/2018   K 3.7 11/24/2018   CL 104 11/24/2018   CREATININE 0.75 11/24/2018   BUN 12 11/24/2018   CO2 22 11/24/2018   TSH 1.43 11/24/2018   HGBA1C 5.1  11/24/2018     Assessment & Plan:   Problem List Items Addressed This Visit    Anxiety associated with depression    Adding buspirone twice daily for management of anxiety       Fatigue   Relevant Orders   Comprehensive metabolic panel (Completed)   B12 and Folate Panel (Completed)   TSH (Completed)   Major depressive disorder with current active episode    Continue trazodone at bedtime as needed for anxiety       Obesity (BMI 30.0-34.9) - Primary   Relevant Orders   Hemoglobin A1c (Completed)   Insulin, Free (Bioactive)    Other Visit Diagnoses    Menorrhagia with regular cycle       Relevant Orders   CBC with Differential/Platelet (Completed)   Iron, TIBC and Ferritin Panel (Completed)   Vitamin D deficiency       Relevant Orders   VITAMIN D 25 Hydroxy (Vit-D Deficiency, Fractures) (Completed)    A total of 25 minutes of face to face time was spent with patient more than half of which was spent in counselling about the above mentioned conditions  and coordination of care   I have discontinued Benjamine Mola B. Lucianne Lei Pratt's oseltamivir, tiZANidine, and pantoprazole. I am also having her maintain her traZODone and buPROPion.  No orders of the defined types were placed in this encounter.   Medications Discontinued During This Encounter  Medication Reason  . oseltamivir (TAMIFLU) 75 MG capsule   . pantoprazole (PROTONIX) 40 MG tablet Patient has not taken in last 30 days  . tiZANidine (ZANAFLEX) 4 MG capsule Patient has not taken in last 30 days    Follow-up: No follow-ups on file.   Crecencio Mc, MD

## 2018-11-25 LAB — IRON,TIBC AND FERRITIN PANEL
%SAT: 27 % (calc) (ref 16–45)
Ferritin: 41 ng/mL (ref 16–232)
Iron: 75 ug/dL (ref 40–190)
TIBC: 283 ug/dL (ref 250–450)

## 2018-11-25 LAB — CBC WITH DIFFERENTIAL/PLATELET
Absolute Monocytes: 760 cells/uL (ref 200–950)
BASOS ABS: 38 {cells}/uL (ref 0–200)
Basophils Relative: 0.5 %
EOS ABS: 190 {cells}/uL (ref 15–500)
Eosinophils Relative: 2.5 %
HEMATOCRIT: 37.2 % (ref 35.0–45.0)
Hemoglobin: 13.2 g/dL (ref 11.7–15.5)
LYMPHS ABS: 2044 {cells}/uL (ref 850–3900)
MCH: 31 pg (ref 27.0–33.0)
MCHC: 35.5 g/dL (ref 32.0–36.0)
MCV: 87.3 fL (ref 80.0–100.0)
MPV: 11.7 fL (ref 7.5–12.5)
Monocytes Relative: 10 %
NEUTROS PCT: 60.1 %
Neutro Abs: 4568 cells/uL (ref 1500–7800)
Platelets: 271 10*3/uL (ref 140–400)
RBC: 4.26 10*6/uL (ref 3.80–5.10)
RDW: 12.8 % (ref 11.0–15.0)
Total Lymphocyte: 26.9 %
WBC: 7.6 10*3/uL (ref 3.8–10.8)

## 2018-11-25 LAB — COMPREHENSIVE METABOLIC PANEL
AG Ratio: 1.3 (calc) (ref 1.0–2.5)
ALKALINE PHOSPHATASE (APISO): 49 U/L (ref 33–115)
ALT: 11 U/L (ref 6–29)
AST: 16 U/L (ref 10–35)
Albumin: 4.1 g/dL (ref 3.6–5.1)
BILIRUBIN TOTAL: 0.6 mg/dL (ref 0.2–1.2)
BUN: 12 mg/dL (ref 7–25)
CALCIUM: 9.2 mg/dL (ref 8.6–10.2)
CO2: 22 mmol/L (ref 20–32)
Chloride: 104 mmol/L (ref 98–110)
Creat: 0.75 mg/dL (ref 0.50–1.10)
Globulin: 3.1 g/dL (calc) (ref 1.9–3.7)
Glucose, Bld: 80 mg/dL (ref 65–99)
Potassium: 3.7 mmol/L (ref 3.5–5.3)
Sodium: 137 mmol/L (ref 135–146)
Total Protein: 7.2 g/dL (ref 6.1–8.1)

## 2018-11-25 LAB — B12 AND FOLATE PANEL
Folate: 11.8 ng/mL
VITAMIN B 12: 328 pg/mL (ref 200–1100)

## 2018-11-25 LAB — VITAMIN D 25 HYDROXY (VIT D DEFICIENCY, FRACTURES): Vit D, 25-Hydroxy: 44 ng/mL (ref 30–100)

## 2018-11-25 LAB — HEMOGLOBIN A1C
Hgb A1c MFr Bld: 5.1 % of total Hgb (ref <5.7)
Mean Plasma Glucose: 100 (calc)
eAG (mmol/L): 5.5 (calc)

## 2018-11-25 LAB — TSH: TSH: 1.43 mIU/L

## 2018-11-26 DIAGNOSIS — F418 Other specified anxiety disorders: Secondary | ICD-10-CM | POA: Insufficient documentation

## 2018-11-26 NOTE — Assessment & Plan Note (Signed)
Adding buspirone twice daily for management of anxiety

## 2018-11-26 NOTE — Assessment & Plan Note (Signed)
Continue trazodone at bedtime as needed for anxiety

## 2018-12-03 LAB — INSULIN, FREE (BIOACTIVE)

## 2019-01-09 ENCOUNTER — Encounter (INDEPENDENT_AMBULATORY_CARE_PROVIDER_SITE_OTHER): Payer: Self-pay

## 2019-01-17 ENCOUNTER — Ambulatory Visit (INDEPENDENT_AMBULATORY_CARE_PROVIDER_SITE_OTHER): Payer: 59 | Admitting: Family Medicine

## 2019-01-23 MED FILL — buPROPion HCL ER (XL) 300 M: 300 | 90 days supply | Qty: 90 | Fill #1

## 2019-01-31 ENCOUNTER — Encounter (INDEPENDENT_AMBULATORY_CARE_PROVIDER_SITE_OTHER): Payer: Self-pay | Admitting: Family Medicine

## 2019-01-31 ENCOUNTER — Ambulatory Visit (INDEPENDENT_AMBULATORY_CARE_PROVIDER_SITE_OTHER): Payer: Self-pay | Admitting: Family Medicine

## 2019-01-31 ENCOUNTER — Ambulatory Visit (INDEPENDENT_AMBULATORY_CARE_PROVIDER_SITE_OTHER): Payer: 59 | Admitting: Family Medicine

## 2019-01-31 VITALS — BP 119/73 | HR 78 | Ht 62.0 in | Wt 178.0 lb

## 2019-01-31 DIAGNOSIS — R5383 Other fatigue: Secondary | ICD-10-CM | POA: Diagnosis not present

## 2019-01-31 DIAGNOSIS — E7849 Other hyperlipidemia: Secondary | ICD-10-CM

## 2019-01-31 DIAGNOSIS — R0602 Shortness of breath: Secondary | ICD-10-CM

## 2019-01-31 DIAGNOSIS — Z9189 Other specified personal risk factors, not elsewhere classified: Secondary | ICD-10-CM | POA: Diagnosis not present

## 2019-01-31 DIAGNOSIS — Z1331 Encounter for screening for depression: Secondary | ICD-10-CM | POA: Diagnosis not present

## 2019-01-31 DIAGNOSIS — E669 Obesity, unspecified: Secondary | ICD-10-CM

## 2019-01-31 DIAGNOSIS — Z6832 Body mass index (BMI) 32.0-32.9, adult: Secondary | ICD-10-CM | POA: Diagnosis not present

## 2019-01-31 DIAGNOSIS — Z0289 Encounter for other administrative examinations: Secondary | ICD-10-CM

## 2019-01-31 NOTE — Progress Notes (Signed)
Office: [phone]  /  Fax: [phone]   Dear Dr. Helene Kelp L.Tullo and Dr. Servando Salina,     Thank you for referring Heather Pratt to our clinic. The following note includes my evaluation and treatment recommendations.  HPI:   Chief Complaint: Heather Pratt has been referred by Dr. Helene Kelp L.Tullo and Dr. Servando Salina for consultation regarding her obesity and obesity related comorbidities.    Heather Pratt (MR# [id number]) is a 48 y.o. female who presents on 01/31/2019 for obesity evaluation and treatment. Current BMI is Body mass index is 32.56 kg/m.  Heather Pratt has been struggling with her weight for many years and has been unsuccessful in either losing weight, maintaining weight loss, or reaching her healthy weight goal.     Heather Pratt attended our information session and states she is currently in the action stage of change and ready to dedicate time achieving and maintaining a healthier weight. Heather Pratt is interested in becoming our patient and working on intensive lifestyle modifications including (but not limited to) diet, exercise and weight loss.     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 she has significant food cravings issues  she snacks frequently in the evenings she skips meals frequently she is frequently drinking liquids with calories she sometimes makes poor food choices she has problems with excessive hunger  she frequently eats larger portions than normal  she has binge eating behaviors she struggles with emotional eating    Fatigue Heather Pratt feels her energy is lower than it  should be. This has worsened with weight gain and has not worsened recently. Heather Pratt admits to waking up still tired. Patient is at risk for obstructive sleep apnea. Patient generally gets 6.5 to 7.5 hours of sleep per night, and states they generally have generally restful sleep. Snoring is present. Apneic episodes are not present. Epworth Sleepiness Score is 6.  Dyspnea on exertion Heather Pratt notes increasing shortness of breath with exercising and seems to be worsening over time with weight gain. She notes getting out of breath sooner with activity than she used to. This has not gotten worse recently. Heather Pratt denies orthopnea.  Hyperlipidemia Heather Pratt has hyperlipidemia and has been trying to improve her cholesterol levels with intensive lifestyle modification including a low saturated fat diet, exercise and weight loss.   At risk for cardiovascular disease Heather Pratt is at a higher than average risk for cardiovascular disease due to hyperlipidemia and obesity.   Depression Screen Heather Pratt's Food and Mood (modified PHQ-9) score was 12. Depression screen PHQ 2/9 01/31/2019  Decreased Interest 1  Down, Depressed, Hopeless 2  PHQ - 2 Score 3  Altered sleeping 2  Tired, decreased energy 2  Change in appetite 2  Feeling bad or failure about yourself  2  Trouble concentrating 0  Moving slowly or fidgety/restless 1  Suicidal thoughts 0  PHQ-9 Score 12  Difficult doing work/chores Somewhat difficult    ASSESSMENT AND PLAN:  Other fatigue - Plan: EKG 12-Lead, Vitamin B12, CBC With Differential, Comprehensive metabolic panel, Folate, Hemoglobin A1c, Insulin, random, T3, T4, free, TSH, VITAMIN D 25 Hydroxy (Vit-D Deficiency, Fractures)  Shortness of breath on exertion  Other hyperlipidemia - Plan: Lipid Panel With LDL/HDL Ratio  Depression screening  At risk for heart disease  Class 1 obesity with serious comorbidity and body mass index (BMI) of 32.0 to 32.9 in adult, unspecified  obesity type  PLAN:  Fatigue Heather Pratt was informed that her fatigue may be related to obesity, depression or many other causes. Labs will be ordered, and in the meanwhile Heather Pratt has agreed to work on diet, exercise and weight loss to help with fatigue. Proper sleep hygiene was discussed including the need for 7-8 hours of quality sleep each night. A sleep study was not ordered based on symptoms and Epworth score. An EKG and an indirect calorimetry was ordered today and Heather Pratt agrees to follow up in 2 weeks.  Dyspnea on exertion Heather Pratt's shortness of breath appears to be obesity related and exercise induced. She has agreed to work on weight loss and gradually increase exercise to treat her exercise induced shortness of breath. If Heather Pratt follows our instructions and loses weight without improvement of her shortness of breath, we will plan to refer to pulmonology. We will order labs, an indirect calorimetry, and an EKG today. We will monitor this condition regularly. Heather Pratt agrees to this plan.  Hyperlipidemia Heather Pratt was informed of the American Heart Association Guidelines emphasizing intensive lifestyle modifications as the first line treatment for hyperlipidemia. We discussed many lifestyle modifications today in depth, and Heather Pratt will continue to work on decreasing saturated fats such as fatty red meat, butter and many fried foods. She will also increase vegetables and lean protein in her diet and continue to work on exercise and weight loss efforts. Labs were ordered today and Heather Pratt agrees to follow up as directed in 2 weeks.  Cardiovascular risk counseling Heather Pratt was given extended (15 minutes) coronary artery disease prevention counseling today. She is 48 y.o. female and has risk factors for heart disease including hyperlipidemia and obesity. We discussed intensive lifestyle modifications today with an emphasis on specific weight loss instructions and strategies. Pt was  also informed of the importance of increasing exercise and decreasing saturated fats to help prevent heart disease.  Depression Screen Heather Pratt had a moderately positive depression screening. Depression is commonly associated with obesity and often results in emotional eating behaviors. We will monitor this closely and work on CBT to help improve the non-hunger eating patterns. Referral to Psychology may be required if no improvement is seen as she continues in our clinic.  Obesity Heather Pratt is currently in the action stage of change and her goal is to continue with weight loss efforts. I recommend Heather Pratt begin the structured treatment plan as follows:  She has agreed to follow the Category 2 plan. Heather Pratt has been instructed to eventually work up to a goal of 150 minutes of combined cardio and strengthening exercise per week for weight loss and overall health benefits. We discussed the following Behavioral Modification Strategies today: increasing lean protein intake, decreasing simple carbohydrates, work on meal planning and easy cooking plans, and dealing with family or coworker sabotage.   She was informed of the importance of frequent follow up visits to maximize her success with intensive lifestyle modifications for her multiple health conditions. She was informed we would discuss her lab results at her next visit unless there is a critical issue that needs to be addressed sooner. Heather Pratt agreed to keep her next visit at the agreed upon time to discuss these results.  ALLERGIES: Not on File  MEDICATIONS: Current  Outpatient Medications on File Prior to Visit  Medication Sig Dispense Refill  . aspirin-acetaminophen-caffeine (EXCEDRIN MIGRAINE) 250-250-65 MG tablet Take 1 tablet by mouth every 6 (six) hours as needed for headache.    Marland Kitchen buPROPion (WELLBUTRIN XL) 300 MG 24 hr tablet TAKE 1 TABLET BY MOUTH DAILY. 90 tablet 2  . cetirizine (ZYRTEC) 10 MG tablet Take 10 mg by mouth daily  as needed for allergies.    . Cholecalciferol (VITAMIN D) 50 MCG (2000 UT) CAPS Take 1 capsule by mouth daily.    Marland Kitchen estradiol (CLIMARA - DOSED IN MG/24 HR) 0.1 mg/24hr patch Place 0.1 mg onto the skin once a week.    Marland Kitchen ibuprofen (ADVIL,MOTRIN) 200 MG tablet Take 200 mg by mouth every 6 (six) hours as needed.    . loratadine (CLARITIN) 10 MG tablet Take 10 mg by mouth daily as needed for allergies.    . traZODone (DESYREL) 50 MG tablet Take 0.5-1 tablets (25-50 mg total) by mouth at bedtime as needed for sleep. 90 tablet 3   No current facility-administered medications on file prior to visit.     PAST MEDICAL HISTORY: Past Medical History:  Diagnosis Date  . Allergy   . Anxiety   . Depression   . HLD (hyperlipidemia)   . Joint pain   . Migraines     PAST SURGICAL HISTORY: Past Surgical History:  Procedure Laterality Date  . CESAREAN SECTION WITH BILATERAL TUBAL LIGATION  005   [place]  . ENDOMETRIAL ABLATION  2011   menorrhagia,  [place]    SOCIAL HISTORY: Social History   Tobacco Use  . Smoking status: Never Smoker  . Smokeless tobacco: Never Used  Substance Use Topics  . Alcohol use: Yes    Alcohol/week: 2.0 standard drinks    Types: 1 Cans of beer, 1 Glasses of wine per week  . Drug use: No    FAMILY HISTORY: Family History  Problem Relation Age of Onset  . Hypertension Mother   . Hyperlipidemia Mother   . Thyroid disease Mother   . Alcohol abuse Father   . Hypertension Father   . Hyperlipidemia Father   . Depression Father   . Eating disorder Father   . Obesity Father   . Hypertension Maternal Grandmother   . Heart disease Maternal Grandmother   . Prostate cancer Maternal Grandfather   . Heart disease Maternal Grandfather   . Hypertension Maternal Grandfather     ROS: Review of Systems  Constitutional: Positive for malaise/fatigue. Negative for weight loss.  HENT: Positive for sinus pain.        Positive for nasal stuffiness. Positive for hay  fever.  Eyes:       Wears glasses or contacts.  Cardiovascular: Negative for orthopnea.  Musculoskeletal: Positive for joint pain and myalgias.  Neurological: Positive for headaches.  Psychiatric/Behavioral: Positive for depression.       Positive for stress.    PHYSICAL EXAM: Blood pressure 119/73, pulse 78, height 5' 2"  (1.575 m), weight 178 lb (80.7 kg), last menstrual period 01/11/2019, SpO2 100 %. Body mass index is 32.56 kg/m. Physical Exam Vitals signs reviewed.  Constitutional:      Appearance: Normal appearance. She is obese.  HENT:     Head: Normocephalic and atraumatic.     Nose: Nose normal.  Eyes:     General: No scleral icterus.    Extraocular Movements: Extraocular movements intact.  Neck:     Musculoskeletal: Normal range of motion and neck supple.  Thyroid: No thyromegaly.     Comments: Negative for thyromegaly. Cardiovascular:     Rate and Rhythm: Normal rate and regular rhythm.  Pulmonary:     Effort: Pulmonary effort is normal. No respiratory distress.  Abdominal:     Palpations: Abdomen is soft.     Tenderness: There is no abdominal tenderness.     Comments: Positive for obesity.  Musculoskeletal: Normal range of motion.     Comments: ROM normal in all extremities.  Skin:    General: Skin is warm and dry.  Neurological:     Mental Status: She is alert and oriented to person, place, and time.     Coordination: Coordination normal.  Psychiatric:        Mood and Affect: Mood normal.        Behavior: Behavior normal.     RECENT LABS AND TESTS: BMET    Component Value Date/Time   NA 137 11/24/2018 1608   K 3.7 11/24/2018 1608   CL 104 11/24/2018 1608   CO2 22 11/24/2018 1608   GLUCOSE 80 11/24/2018 1608   BUN 12 11/24/2018 1608   CREATININE 0.75 11/24/2018 1608   CALCIUM 9.2 11/24/2018 1608   Lab Results  Component Value Date   HGBA1C 5.1 11/24/2018   No results found for: INSULIN CBC    Component Value Date/Time   WBC 7.6  11/24/2018 1608   RBC 4.26 11/24/2018 1608   HGB 13.2 11/24/2018 1608   HCT 37.2 11/24/2018 1608   PLT 271 11/24/2018 1608   MCV 87.3 11/24/2018 1608   MCH 31.0 11/24/2018 1608   MCHC 35.5 11/24/2018 1608   RDW 12.8 11/24/2018 1608   LYMPHSABS 2,044 11/24/2018 1608   MONOABS 0.6 09/30/2017 0822   EOSABS 190 11/24/2018 1608   BASOSABS 38 11/24/2018 1608   Iron/TIBC/Ferritin/ %Sat    Component Value Date/Time   IRON 75 11/24/2018 1608   TIBC 283 11/24/2018 1608   FERRITIN 41 11/24/2018 1608   IRONPCTSAT 27 11/24/2018 1608   Lipid Panel     Component Value Date/Time   CHOL 179 09/30/2017 0822   TRIG 84.0 09/30/2017 0822   HDL 51.00 09/30/2017 0822   CHOLHDL 4 09/30/2017 0822   VLDL 16.8 09/30/2017 0822   LDLCALC 111 (H) 09/30/2017 0822   Hepatic Function Panel     Component Value Date/Time   PROT 7.2 11/24/2018 1608   ALBUMIN 3.8 09/30/2017 0822   AST 16 11/24/2018 1608   ALT 11 11/24/2018 1608   ALKPHOS 39 09/30/2017 0822   BILITOT 0.6 11/24/2018 1608      Component Value Date/Time   TSH 1.43 11/24/2018 1608   TSH 1.53 09/30/2017 0822   Results for VAN RAHCEL, SHUTES (MRN [id number]) as of 01/31/2019 08:35  Ref. Range 11/24/2018 16:08  Vitamin D, 25-Hydroxy Latest Ref Range: 30 - 100 ng/mL 44    ECG  shows NSR with a rate of 80 BPM. INDIRECT CALORIMETER done today shows a VO2 of 254 and a REE of 1767.  Her calculated basal metabolic rate is 2536 thus her basal metabolic rate is better than expected.  OBESITY BEHAVIORAL INTERVENTION VISIT  Today's visit was # 1   Starting weight: 178 lbs Starting date: 01/31/2019 Today's weight : Weight: 178 lb (80.7 kg)  Today's date: 01/31/2019 Total lbs lost to date: 0    01/31/2019  Height 5' 2"  (1.575 m)  Weight 178 lb (80.7 kg)  BMI (Calculated) 32.55  BLOOD PRESSURE - SYSTOLIC 644  BLOOD PRESSURE - DIASTOLIC 73  Waist Measurement  41 inches   Body Fat % 39.7 %  Total Body Water (lbs) 73.8 lbs  RMR 1767    ASK: We discussed the diagnosis of obesity with [place] today and Maythe agreed to give [place] permission to discuss obesity behavioral modification therapy today.  ASSESS: Heather Pratt has the diagnosis of obesity and her BMI today is 32.55. Ashawna is in the action stage of change.   ADVISE: Anniah was educated on the multiple health risks of obesity as well as the benefit of weight loss to improve her health. She was advised of the need for long term treatment and the importance of lifestyle modifications to improve her current health and to decrease her risk of future health problems.  AGREE: Multiple dietary modification options and treatment options were discussed and Ashlie agreed to follow the recommendations documented in the above note.  ARRANGE: Roe was educated on the importance of frequent visits to treat obesity as outlined per CMS and USPSTF guidelines and agreed to schedule her next follow up appointment today.  IMarcille Blanco, CMA, am acting as transcriptionist for Starlyn Skeans, MD  I have reviewed the above documentation for accuracy and completeness, and I agree with the above. -Dennard Nip, MD

## 2019-02-01 LAB — COMPREHENSIVE METABOLIC PANEL
ALT: 16 IU/L (ref 0–32)
AST: 15 IU/L (ref 0–40)
Albumin/Globulin Ratio: 1.5 (ref 1.2–2.2)
Albumin: 4 g/dL (ref 3.8–4.8)
Alkaline Phosphatase: 61 IU/L (ref 39–117)
BUN/Creatinine Ratio: 14 (ref 9–23)
BUN: 9 mg/dL (ref 6–24)
Bilirubin Total: 0.3 mg/dL (ref 0.0–1.2)
CO2: 22 mmol/L (ref 20–29)
Calcium: 9 mg/dL (ref 8.7–10.2)
Chloride: 104 mmol/L (ref 96–106)
Creatinine, Ser: 0.65 mg/dL (ref 0.57–1.00)
GFR calc Af Amer: 123 mL/min/{1.73_m2} (ref >59)
GFR calc non Af Amer: 107 mL/min/{1.73_m2} (ref >59)
Globulin, Total: 2.7 g/dL (ref 1.5–4.5)
Glucose: 80 mg/dL (ref 65–99)
Potassium: 4.8 mmol/L (ref 3.5–5.2)
Sodium: 137 mmol/L (ref 134–144)
Total Protein: 6.7 g/dL (ref 6.0–8.5)

## 2019-02-01 LAB — LIPID PANEL WITH LDL/HDL RATIO
Cholesterol, Total: 213 mg/dL — ABNORMAL HIGH (ref 100–199)
HDL: 60 mg/dL (ref >39)
LDL CALC: 143 mg/dL — AB (ref 0–99)
LDl/HDL Ratio: 2.4 ratio (ref 0.0–3.2)
Triglycerides: 52 mg/dL (ref 0–149)
VLDL Cholesterol Cal: 10 mg/dL (ref 5–40)

## 2019-02-01 LAB — T4, FREE: Free T4: 1.06 ng/dL (ref 0.82–1.77)

## 2019-02-01 LAB — INSULIN, RANDOM: INSULIN: 7.7 u[IU]/mL (ref 2.6–24.9)

## 2019-02-01 LAB — CBC WITH DIFFERENTIAL
Basophils Absolute: 0.1 10*3/uL (ref 0.0–0.2)
Basos: 1 %
EOS (ABSOLUTE): 0.2 10*3/uL (ref 0.0–0.4)
Eos: 4 %
Hematocrit: 38 % (ref 34.0–46.6)
Hemoglobin: 13 g/dL (ref 11.1–15.9)
Immature Grans (Abs): 0 10*3/uL (ref 0.0–0.1)
Immature Granulocytes: 1 %
Lymphocytes Absolute: 1.6 10*3/uL (ref 0.7–3.1)
Lymphs: 27 %
MCH: 29.8 pg (ref 26.6–33.0)
MCHC: 34.2 g/dL (ref 31.5–35.7)
MCV: 87 fL (ref 79–97)
Monocytes Absolute: 0.5 10*3/uL (ref 0.1–0.9)
Monocytes: 9 %
NEUTROS ABS: 3.4 10*3/uL (ref 1.4–7.0)
Neutrophils: 58 %
RBC: 4.36 x10E6/uL (ref 3.77–5.28)
RDW: 12.8 % (ref 11.7–15.4)
WBC: 5.7 10*3/uL (ref 3.4–10.8)

## 2019-02-01 LAB — HEMOGLOBIN A1C
Est. average glucose Bld gHb Est-mCnc: 103 mg/dL
HEMOGLOBIN A1C: 5.2 % (ref 4.8–5.6)

## 2019-02-01 LAB — VITAMIN D 25 HYDROXY (VIT D DEFICIENCY, FRACTURES): Vit D, 25-Hydroxy: 38.7 ng/mL (ref 30.0–100.0)

## 2019-02-01 LAB — T3: T3, Total: 101 ng/dL (ref 71–180)

## 2019-02-01 LAB — FOLATE: Folate: 6.2 ng/mL (ref >3.0)

## 2019-02-01 LAB — VITAMIN B12: Vitamin B-12: 417 pg/mL (ref 232–1245)

## 2019-02-01 LAB — TSH: TSH: 1.63 u[IU]/mL (ref 0.450–4.500)

## 2019-02-12 MED FILL — ESTRADIOL 0.1 MG/DAY PATCH: 0.1 | 28 days supply | Qty: 4 | Fill #0

## 2019-02-14 ENCOUNTER — Other Ambulatory Visit: Payer: Self-pay

## 2019-02-14 ENCOUNTER — Encounter (INDEPENDENT_AMBULATORY_CARE_PROVIDER_SITE_OTHER): Payer: Self-pay | Admitting: Family Medicine

## 2019-02-14 ENCOUNTER — Ambulatory Visit (INDEPENDENT_AMBULATORY_CARE_PROVIDER_SITE_OTHER): Payer: 59 | Admitting: Family Medicine

## 2019-02-14 VITALS — BP 112/67 | HR 86 | Ht 62.0 in | Wt 174.0 lb

## 2019-02-14 DIAGNOSIS — Z9189 Other specified personal risk factors, not elsewhere classified: Secondary | ICD-10-CM

## 2019-02-14 DIAGNOSIS — Z6831 Body mass index (BMI) 31.0-31.9, adult: Secondary | ICD-10-CM | POA: Diagnosis not present

## 2019-02-14 DIAGNOSIS — E781 Pure hyperglyceridemia: Secondary | ICD-10-CM | POA: Diagnosis not present

## 2019-02-14 DIAGNOSIS — E669 Obesity, unspecified: Secondary | ICD-10-CM | POA: Diagnosis not present

## 2019-02-14 DIAGNOSIS — E559 Vitamin D deficiency, unspecified: Secondary | ICD-10-CM | POA: Diagnosis not present

## 2019-02-14 MED ORDER — VITAMIN D (ERGOCALCIFEROL) 1.25 MG (50000 UNIT) PO CAPS: 50000.0000 [IU] | ORAL_CAPSULE | ORAL | 0 refills | Status: DC

## 2019-02-14 MED FILL — VIT D2 1.25 MG (50,000 UNIT: 1.25 MG | 28 days supply | Qty: 4 | Fill #0

## 2019-02-14 NOTE — Progress Notes (Signed)
Office: [phone]  /  Fax: [phone]   HPI:   Chief Complaint: OBESITY Heather Pratt is here to discuss her progress with her obesity treatment plan. She is on the Category 2 plan and is following her eating plan approximately 90 % of the time. She states she is exercising 0 minutes 0 times per week. Heather Pratt did very well with weight loss on her Category 2 plan. She found her plan a bit boring, but did well eating all the food.  Her weight is 174 lb (78.9 kg) today and has had a weight loss of 4 pounds over a period of 2 weeks since her last visit. She has lost 4 lbs since starting treatment with [place].  Vitamin D Deficiency Heather Pratt has a new diagnosis of vitamin D deficiency. She is currently on OTC vit D, but is not yet at goal. Heather Pratt admits fatigue.  Hyperlipidemia (Pure) Heather Pratt has a history of hyperlipidemia. Her LDL is elevated at 143 on 01/31/19 and she is not on a statin. She has been trying to improve her cholesterol levels with intensive lifestyle modification including a low saturated fat diet, exercise, and weight loss. She admits fatigue and denies chest pain.  At risk for cardiovascular disease Heather Pratt is at a higher than average risk for cardiovascular disease due to hyperlipidemia and obesity.   ASSESSMENT AND PLAN:  Vitamin D deficiency - Plan: Vitamin D, Ergocalciferol, (DRISDOL) 1.25 MG (50000 UT) CAPS capsule  Pure hypertriglyceridemia  At risk for heart disease  Class 1 obesity with serious comorbidity and body mass index (BMI) of 31.0 to 31.9 in adult, unspecified obesity type  PLAN:  Vitamin D Deficiency Heather Pratt was informed that low vitamin D levels contributes to fatigue and are associated with obesity, breast, and colon cancer. Heather Pratt agrees to hold OTC vitamin D and to start to take prescription Vit D @50 ,000 IU every week #4 with no refills and will follow up for routine testing of vitamin D, at least 2-3 times per year. She was informed  of the risk of over-replacement of vitamin D and agrees to not increase her dose unless she discusses this with [place] first. We will recheck labs in 3 months. Heather Pratt agrees to follow up in 2 to 3 weeks as directed.  Hyperlipidemia (Pure) Heather Pratt was informed of the American Heart Association Guidelines emphasizing intensive lifestyle modifications as the first line treatment for hyperlipidemia. We discussed many lifestyle modifications today in depth, and Heather Pratt will continue to work on decreasing saturated fats such as fatty red meat, butter, and many fried foods. She will also increase vegetables and lean protein in her diet and continue to work on exercise and weight loss efforts. Heather Pratt agrees to continue her diet, exercise, and weight loss. We will recheck labs in 3 months and she agrees to follow up in 2 to 3 weeks.  Cardiovascular risk counseling Heather Pratt was given extended (30 minutes) coronary artery disease prevention counseling today. She is 48 y.o. female and has risk factors for heart disease including hyperlipidemia and obesity. We discussed intensive lifestyle modifications today with an emphasis on specific weight loss instructions and strategies. Pt was also informed of the importance of increasing exercise and decreasing saturated fats to help prevent heart disease.  Obesity Heather Pratt is currently in the action stage of change. As such, her goal is to continue with weight loss efforts. She has agreed to follow the Category 2 plan. Heather Pratt has been instructed to work up to a goal of 150 minutes of  combined cardio and strengthening exercise per week for weight loss and overall health benefits. We discussed the following Behavioral Modification Strategies today: increasing lean protein intake, decreasing simple carbohydrates, and work on meal planning and easy cooking plans.  Heather Pratt has agreed to follow up with our clinic in 2 to 3 weeks. She was informed of the importance  of frequent follow up visits to maximize her success with intensive lifestyle modifications for her multiple health conditions.  ALLERGIES: Not on File  MEDICATIONS: Current Outpatient Medications on File Prior to Visit  Medication Sig Dispense Refill  . aspirin-acetaminophen-caffeine (EXCEDRIN MIGRAINE) 250-250-65 MG tablet Take 1 tablet by mouth every 6 (six) hours as needed for headache.    Marland Kitchen buPROPion (WELLBUTRIN XL) 300 MG 24 hr tablet TAKE 1 TABLET BY MOUTH DAILY. 90 tablet 2  . cetirizine (ZYRTEC) 10 MG tablet Take 10 mg by mouth daily as needed for allergies.    Marland Kitchen estradiol (CLIMARA - DOSED IN MG/24 HR) 0.1 mg/24hr patch Place 0.1 mg onto the skin once a week.    Marland Kitchen ibuprofen (ADVIL,MOTRIN) 200 MG tablet Take 200 mg by mouth every 6 (six) hours as needed.    . loratadine (CLARITIN) 10 MG tablet Take 10 mg by mouth daily as needed for allergies.    . traZODone (DESYREL) 50 MG tablet Take 0.5-1 tablets (25-50 mg total) by mouth at bedtime as needed for sleep. 90 tablet 3   No current facility-administered medications on file prior to visit.     PAST MEDICAL HISTORY: Past Medical History:  Diagnosis Date  . Allergy   . Anxiety   . Depression   . HLD (hyperlipidemia)   . Joint pain   . Migraines     PAST SURGICAL HISTORY: Past Surgical History:  Procedure Laterality Date  . CESAREAN SECTION WITH BILATERAL TUBAL LIGATION  005   [facility]  . ENDOMETRIAL ABLATION  2011   menorrhagia,  [place]    SOCIAL HISTORY: Social History   Tobacco Use  . Smoking status: Never Smoker  . Smokeless tobacco: Never Used  Substance Use Topics  . Alcohol use: Yes    Alcohol/week: 2.0 standard drinks    Types: 1 Cans of beer, 1 Glasses of wine per week  . Drug use: No    FAMILY HISTORY: Family History  Problem Relation Age of Onset  . Hypertension Mother   . Hyperlipidemia Mother   . Thyroid disease Mother   . Alcohol abuse Father   . Hypertension Father   . Hyperlipidemia  Father   . Depression Father   . Eating disorder Father   . Obesity Father   . Hypertension Maternal Grandmother   . Heart disease Maternal Grandmother   . Prostate cancer Maternal Grandfather   . Heart disease Maternal Grandfather   . Hypertension Maternal Grandfather     ROS: Review of Systems  Constitutional: Positive for malaise/fatigue and weight loss.  Cardiovascular: Negative for chest pain.   PHYSICAL EXAM: Blood pressure 112/67, pulse 86, height 5' 2"  (1.575 m), weight 174 lb (78.9 kg), last menstrual period 01/21/2019, SpO2 99 %. Body mass index is 31.83 kg/m. Physical Exam Vitals signs reviewed.  Constitutional:      Appearance: Normal appearance. She is obese.  Cardiovascular:     Rate and Rhythm: Normal rate.  Pulmonary:     Effort: Pulmonary effort is normal.  Musculoskeletal: Normal range of motion.  Skin:    General: Skin is warm and dry.  Neurological:  Mental Status: She is alert and oriented to person, place, and time.  Psychiatric:        Mood and Affect: Mood normal.        Behavior: Behavior normal.    RECENT LABS AND TESTS: BMET    Component Value Date/Time   NA 137 01/31/2019 0839   K 4.8 01/31/2019 0839   CL 104 01/31/2019 0839   CO2 22 01/31/2019 0839   GLUCOSE 80 01/31/2019 0839   GLUCOSE 80 11/24/2018 1608   BUN 9 01/31/2019 0839   CREATININE 0.65 01/31/2019 0839   CREATININE 0.75 11/24/2018 1608   CALCIUM 9.0 01/31/2019 0839   GFRNONAA 107 01/31/2019 0839   GFRAA 123 01/31/2019 0839   Lab Results  Component Value Date   HGBA1C 5.2 01/31/2019   HGBA1C 5.1 11/24/2018   HGBA1C 5.5 09/30/2017   Lab Results  Component Value Date   INSULIN 7.7 01/31/2019   CBC    Component Value Date/Time   WBC 5.7 01/31/2019 0839   WBC 7.6 11/24/2018 1608   RBC 4.36 01/31/2019 0839   RBC 4.26 11/24/2018 1608   HGB 13.0 01/31/2019 0839   HCT 38.0 01/31/2019 0839   PLT 271 11/24/2018 1608   MCV 87 01/31/2019 0839   MCH 29.8  01/31/2019 0839   MCH 31.0 11/24/2018 1608   MCHC 34.2 01/31/2019 0839   MCHC 35.5 11/24/2018 1608   RDW 12.8 01/31/2019 0839   LYMPHSABS 1.6 01/31/2019 0839   MONOABS 0.6 09/30/2017 0822   EOSABS 0.2 01/31/2019 0839   BASOSABS 0.1 01/31/2019 0839   Iron/TIBC/Ferritin/ %Sat    Component Value Date/Time   IRON 75 11/24/2018 1608   TIBC 283 11/24/2018 1608   FERRITIN 41 11/24/2018 1608   IRONPCTSAT 27 11/24/2018 1608   Lipid Panel     Component Value Date/Time   CHOL 213 (H) 01/31/2019 0839   TRIG 52 01/31/2019 0839   HDL 60 01/31/2019 0839   CHOLHDL 4 09/30/2017 0822   VLDL 16.8 09/30/2017 0822   LDLCALC 143 (H) 01/31/2019 0839   Hepatic Function Panel     Component Value Date/Time   PROT 6.7 01/31/2019 0839   ALBUMIN 4.0 01/31/2019 0839   AST 15 01/31/2019 0839   ALT 16 01/31/2019 0839   ALKPHOS 61 01/31/2019 0839   BILITOT 0.3 01/31/2019 0839      Component Value Date/Time   TSH 1.630 01/31/2019 0839   TSH 1.43 11/24/2018 1608   TSH 1.53 09/30/2017 0822   Results for VAN STEVE, GREGG (MRN [id number]) as of 02/14/2019 10:06  Ref. Range 01/31/2019 08:39  Vitamin D, 25-Hydroxy Latest Ref Range: 30.0 - 100.0 ng/mL 38.7   OBESITY BEHAVIORAL INTERVENTION VISIT  Today's visit was # 2   Starting weight: 178 lbs Starting date: 01/31/19 Today's weight : Weight: 174 lb (78.9 kg)  Today's date: 02/14/2019 Total lbs lost to date: 4    02/14/2019  Height 5' 2"  (1.575 m)  Weight 174 lb (78.9 kg)  BMI (Calculated) 31.82  BLOOD PRESSURE - SYSTOLIC 409  BLOOD PRESSURE - DIASTOLIC 67   Body Fat % 81.1 %  Total Body Water (lbs) 72.4 lbs   ASK: We discussed the diagnosis of obesity with [place] today and Kelli agreed to give [place] permission to discuss obesity behavioral modification therapy today.  ASSESS: Vincenza has the diagnosis of obesity and her BMI today is 31.82. Mechel is in the action stage of change.   ADVISE: Liane was  educated on the multiple health risks of obesity as well as the benefit of weight loss to improve her health. She was advised of the need for long term treatment and the importance of lifestyle modifications to improve her current health and to decrease her risk of future health problems.  AGREE: Multiple dietary modification options and treatment options were discussed and Marcelle agreed to follow the recommendations documented in the above note.  ARRANGE: Rylah was educated on the importance of frequent visits to treat obesity as outlined per CMS and USPSTF guidelines and agreed to schedule her next follow up appointment today.  IMarcille Blanco, CMA, am acting as transcriptionist for Starlyn Skeans, MD  I have reviewed the above documentation for accuracy and completeness, and I agree with the above. -Dennard Nip, MD

## 2019-02-26 ENCOUNTER — Other Ambulatory Visit: Payer: Self-pay | Admitting: Internal Medicine

## 2019-02-26 MED ORDER — BUSPIRONE HCL 5 MG PO TABS: 5.0000 mg | ORAL_TABLET | Freq: Three times a day (TID) | ORAL | 2 refills | Status: DC

## 2019-02-26 NOTE — Progress Notes (Signed)
Buspirone sent to [place]

## 2019-02-28 ENCOUNTER — Encounter (INDEPENDENT_AMBULATORY_CARE_PROVIDER_SITE_OTHER): Payer: Self-pay

## 2019-03-01 MED FILL — busPIRone HCL 5 MG TABS: 5 | 30 days supply | Qty: 90 | Fill #0

## 2019-03-07 ENCOUNTER — Ambulatory Visit (INDEPENDENT_AMBULATORY_CARE_PROVIDER_SITE_OTHER): Payer: 59 | Admitting: Family Medicine

## 2019-03-07 ENCOUNTER — Other Ambulatory Visit: Payer: Self-pay

## 2019-03-07 ENCOUNTER — Encounter (INDEPENDENT_AMBULATORY_CARE_PROVIDER_SITE_OTHER): Payer: Self-pay | Admitting: Family Medicine

## 2019-03-07 DIAGNOSIS — F418 Other specified anxiety disorders: Secondary | ICD-10-CM

## 2019-03-07 DIAGNOSIS — Z6831 Body mass index (BMI) 31.0-31.9, adult: Secondary | ICD-10-CM | POA: Diagnosis not present

## 2019-03-07 DIAGNOSIS — E669 Obesity, unspecified: Secondary | ICD-10-CM | POA: Diagnosis not present

## 2019-03-07 DIAGNOSIS — E559 Vitamin D deficiency, unspecified: Secondary | ICD-10-CM

## 2019-03-07 MED ORDER — VITAMIN D (ERGOCALCIFEROL) 1.25 MG (50000 UNIT) PO CAPS: 50000.0000 [IU] | ORAL_CAPSULE | ORAL | 0 refills | Status: DC

## 2019-03-07 NOTE — Progress Notes (Signed)
Office: [phone]  /  Fax: [phone] TeleHealth Visit:  Heather Pratt has consented to this TeleHealth visit today via Face Time. The patient is located at home, the provider is located at the [facility] office. The participants in this visit include the listed provider and patient.   HPI:   Chief Complaint: OBESITY Heather Pratt is here to discuss her progress with her obesity treatment plan. She is on the Category 2 plan and is following her eating plan approximately 75 % of the time. She states she is taking [employer] classes 30 to 40 minutes 2 to 3 times per week.and playing tennis on the weekends for 60 minutes 1 to 2 times. Heather Pratt has been trying to follow the Category 2 plan the best that she can due to the [place] shortage. She thinks that she has lost approximately 2 to 3 pounds since her last visit. Heather Pratt is struggling with some stress and comfort eating, but she is trying to be mindful.  We were unable to weigh the patient today for this TeleHealth visit. She thinks that she has lost weight since her last visit. She has lost 4 lbs since starting treatment with [place].  Vitamin D Deficiency Heather Pratt has a diagnosis of vitamin D deficiency. She is currently stable on vit D, but is not yet at goal. Heather Pratt denies nausea, vomiting, or muscle weakness.  Depression with Anxiety Heather Pratt started Buspar today to help with her worsening anxiety. She notes that she has increased stress and comfort eating and using food for comfort to the extent that it is negatively impacting her health. She is trying to avoid this behavior. She has been working on behavior modification techniques to help reduce her emotional eating and has been somewhat successful. She shows no sign of suicidal or homicidal ideations.  ASSESSMENT AND PLAN:  Vitamin D deficiency - Plan: Vitamin D, Ergocalciferol, (DRISDOL) 1.25 MG (50000 UT) CAPS capsule  Depression with anxiety  Class 1  obesity with serious comorbidity and body mass index (BMI) of 31.0 to 31.9 in adult, unspecified obesity type  PLAN:  Vitamin D Deficiency Heather Pratt was informed that low vitamin D levels contribute to fatigue and are associated with obesity, breast, and colon cancer. [name] agrees to continue to take prescription Vit D @50 ,000 IU every week #4 with no refills and will follow up for routine testing of vitamin D, at least 2-3 times per year. She was informed of the risk of over-replacement of vitamin D and agrees to not increase her dose unless she discusses this with [place] first. Heather Pratt agrees to follow up in 2 weeks as directed.  Depression with Emotional Eating Behaviors We discussed behavior modification techniques today to help Heather Pratt deal with her emotional eating and depression. She has agreed to take Wellbutrin and Buspar as prescribed. Cognitive behavioral therapies were offered to help with emotional eating. Heather Pratt agreed to follow up as directed.  Obesity Heather Pratt is currently in the action stage of change. As such, her goal is to continue with weight loss efforts.  She has agreed to follow the Category 2 plan. Heather Pratt has been instructed to work up to a goal of 150 minutes of combined cardio and strengthening exercise per week for weight loss and overall health benefits. We discussed the following Behavioral Modification Strategies today: increasing lean protein intake, decreasing simple carbohydrates, work on meal planning and easy cooking plans, emotional eating strategies, no skipping meals, keeping healthy foods in the home, better snacking choices.and  ways to avoid boredom eating.  Heather Pratt has agreed to follow up with our clinic in 2 weeks. She was informed of the importance of frequent follow up visits to maximize her success with intensive lifestyle modifications for her multiple health conditions.  ALLERGIES: Not on File  MEDICATIONS: Current Outpatient  Medications on File Prior to Visit  Medication Sig Dispense Refill  . aspirin-acetaminophen-caffeine (EXCEDRIN MIGRAINE) 250-250-65 MG tablet Take 1 tablet by mouth every 6 (six) hours as needed for headache.    Marland Kitchen buPROPion (WELLBUTRIN XL) 300 MG 24 hr tablet TAKE 1 TABLET BY MOUTH DAILY. 90 tablet 2  . busPIRone (BUSPAR) 5 MG tablet Take 1 tablet (5 mg total) by mouth 3 (three) times daily. 90 tablet 2  . cetirizine (ZYRTEC) 10 MG tablet Take 10 mg by mouth daily as needed for allergies.    Marland Kitchen estradiol (CLIMARA - DOSED IN MG/24 HR) 0.1 mg/24hr patch Place 0.1 mg onto the skin once a week.    Marland Kitchen ibuprofen (ADVIL,MOTRIN) 200 MG tablet Take 200 mg by mouth every 6 (six) hours as needed.    . loratadine (CLARITIN) 10 MG tablet Take 10 mg by mouth daily as needed for allergies.    . traZODone (DESYREL) 50 MG tablet Take 0.5-1 tablets (25-50 mg total) by mouth at bedtime as needed for sleep. 90 tablet 3   No current facility-administered medications on file prior to visit.     PAST MEDICAL HISTORY: Past Medical History:  Diagnosis Date  . Allergy   . Anxiety   . Depression   . HLD (hyperlipidemia)   . Joint pain   . Migraines     PAST SURGICAL HISTORY: Past Surgical History:  Procedure Laterality Date  . CESAREAN SECTION WITH BILATERAL TUBAL LIGATION  005   [facility]  . ENDOMETRIAL ABLATION  2011   menorrhagia,  [place]    SOCIAL HISTORY: Social History   Tobacco Use  . Smoking status: Never Smoker  . Smokeless tobacco: Never Used  Substance Use Topics  . Alcohol use: Yes    Alcohol/week: 2.0 standard drinks    Types: 1 Cans of beer, 1 Glasses of wine per week  . Drug use: No    FAMILY HISTORY: Family History  Problem Relation Age of Onset  . Hypertension Mother   . Hyperlipidemia Mother   . Thyroid disease Mother   . Alcohol abuse Father   . Hypertension Father   . Hyperlipidemia Father   . Depression Father   . Eating disorder Father   . Obesity Father   .  Hypertension Maternal Grandmother   . Heart disease Maternal Grandmother   . Prostate cancer Maternal Grandfather   . Heart disease Maternal Grandfather   . Hypertension Maternal Grandfather     ROS: Review of Systems  Gastrointestinal: Negative for nausea and vomiting.  Musculoskeletal:       Negative for muscle weakness.  Psychiatric/Behavioral: Positive for depression. Negative for suicidal ideas. The patient is nervous/anxious.        Negative for homicidal ideations.    PHYSICAL EXAM: Pt in no acute distress  RECENT LABS AND TESTS: BMET    Component Value Date/Time   NA 137 01/31/2019 0839   K 4.8 01/31/2019 0839   CL 104 01/31/2019 0839   CO2 22 01/31/2019 0839   GLUCOSE 80 01/31/2019 0839   GLUCOSE 80 11/24/2018 1608   BUN 9 01/31/2019 0839   CREATININE 0.65 01/31/2019 0839   CREATININE 0.75 11/24/2018 1608  CALCIUM 9.0 01/31/2019 0839   GFRNONAA 107 01/31/2019 0839   GFRAA 123 01/31/2019 0839   Lab Results  Component Value Date   HGBA1C 5.2 01/31/2019   HGBA1C 5.1 11/24/2018   HGBA1C 5.5 09/30/2017   Lab Results  Component Value Date   INSULIN 7.7 01/31/2019   CBC    Component Value Date/Time   WBC 5.7 01/31/2019 0839   WBC 7.6 11/24/2018 1608   RBC 4.36 01/31/2019 0839   RBC 4.26 11/24/2018 1608   HGB 13.0 01/31/2019 0839   HCT 38.0 01/31/2019 0839   PLT 271 11/24/2018 1608   MCV 87 01/31/2019 0839   MCH 29.8 01/31/2019 0839   MCH 31.0 11/24/2018 1608   MCHC 34.2 01/31/2019 0839   MCHC 35.5 11/24/2018 1608   RDW 12.8 01/31/2019 0839   LYMPHSABS 1.6 01/31/2019 0839   MONOABS 0.6 09/30/2017 0822   EOSABS 0.2 01/31/2019 0839   BASOSABS 0.1 01/31/2019 0839   Iron/TIBC/Ferritin/ %Sat    Component Value Date/Time   IRON 75 11/24/2018 1608   TIBC 283 11/24/2018 1608   FERRITIN 41 11/24/2018 1608   IRONPCTSAT 27 11/24/2018 1608   Lipid Panel     Component Value Date/Time   CHOL 213 (H) 01/31/2019 0839   TRIG 52 01/31/2019 0839   HDL 60  01/31/2019 0839   CHOLHDL 4 09/30/2017 0822   VLDL 16.8 09/30/2017 0822   LDLCALC 143 (H) 01/31/2019 0839   Hepatic Function Panel     Component Value Date/Time   PROT 6.7 01/31/2019 0839   ALBUMIN 4.0 01/31/2019 0839   AST 15 01/31/2019 0839   ALT 16 01/31/2019 0839   ALKPHOS 61 01/31/2019 0839   BILITOT 0.3 01/31/2019 0839      Component Value Date/Time   TSH 1.630 01/31/2019 0839   TSH 1.43 11/24/2018 1608   TSH 1.53 09/30/2017 0822   Results for VAN CHANTAY, WHITELOCK (MRN [id number]) as of 03/07/2019 09:48  Ref. Range 01/31/2019 08:39  Vitamin D, 25-Hydroxy Latest Ref Range: 30.0 - 100.0 ng/mL 38.7     I, Marcille Blanco, CMA, am acting as transcriptionist for Starlyn Skeans, MD I have reviewed the above documentation for accuracy and completeness, and I agree with the above. -Dennard Nip, MD

## 2019-03-14 MED FILL — VIT D2 1.25 MG (50,000 UNIT: 1.25 MG | 28 days supply | Qty: 4 | Fill #0

## 2019-03-21 ENCOUNTER — Encounter (INDEPENDENT_AMBULATORY_CARE_PROVIDER_SITE_OTHER): Payer: Self-pay | Admitting: Family Medicine

## 2019-03-21 ENCOUNTER — Other Ambulatory Visit: Payer: Self-pay

## 2019-03-21 ENCOUNTER — Ambulatory Visit (INDEPENDENT_AMBULATORY_CARE_PROVIDER_SITE_OTHER): Payer: 59 | Admitting: Family Medicine

## 2019-03-21 DIAGNOSIS — F419 Anxiety disorder, unspecified: Secondary | ICD-10-CM

## 2019-03-21 DIAGNOSIS — E669 Obesity, unspecified: Secondary | ICD-10-CM

## 2019-03-21 DIAGNOSIS — Z6831 Body mass index (BMI) 31.0-31.9, adult: Secondary | ICD-10-CM | POA: Diagnosis not present

## 2019-03-22 NOTE — Progress Notes (Signed)
Office: [phone]  /  Fax: [phone] TeleHealth Visit:  Heather Pratt has verbally consented to this TeleHealth visit today. The patient is located at home, the provider is located at the [facility] office. The participants in this visit include the listed provider and patient. The visit was conducted today via Face Time.  HPI:   Chief Complaint: OBESITY Heather Pratt is here to discuss her progress with her obesity treatment plan. She is on the Category 2 plan and is following her eating plan approximately 75 to 80 % of the time. She states she is doing cardio, boot camp, and playing tennis 40 minutes 6 times per week. Heather Pratt feels that she has lost some weight since her our last visit, but she isn't weighing herself. She feels that she is doing well with her Category 2 plan overall. Her hunger is controlled.  We were unable to weigh the patient today for this TeleHealth visit. She feels as if she has lost weight since her last visit. She has lost 4 lbs since starting treatment with [place].  Anxiety Heather Pratt is on Buspar and feels anxiety is somewhat improved. She has increased exercise, which she feels helps, but she is still not sleeping.  ASSESSMENT AND PLAN:  Anxiety  Class 1 obesity with serious comorbidity and body mass index (BMI) of 31.0 to 31.9 in adult, unspecified obesity type  PLAN:  Anxiety We discussed cognitive behavioral therapies to help Heather Pratt stay calm and keep herself occupied to minimize time available to obsess. [name] agreed to try some strategies and we will continue to monitor her. She was offered emotional support today. Heather Pratt agreed to follow up in 3 weeks.  I spent > than 50% of the 15 minute visit on counseling as documented in the note.  Obesity Heather Pratt is currently in the action stage of change. As such, her goal is to maintain weight for now. She has agreed to follow the Category 2 plan. Heather Pratt has been  instructed to continue er current exercises. We discussed the following Behavioral Modification Strategies today: work on meal planning and easy cooking plans, emotional eating strategies, and ways to avoid boredom eating.  Heather Pratt has agreed to follow up with our clinic in 3 weeks. She was informed of the importance of frequent follow up visits to maximize her success with intensive lifestyle modifications for her multiple health conditions.  ALLERGIES: Not on File  MEDICATIONS: Current Outpatient Medications on File Prior to Visit  Medication Sig Dispense Refill  . aspirin-acetaminophen-caffeine (EXCEDRIN MIGRAINE) 250-250-65 MG tablet Take 1 tablet by mouth every 6 (six) hours as needed for headache.    Marland Kitchen buPROPion (WELLBUTRIN XL) 300 MG 24 hr tablet TAKE 1 TABLET BY MOUTH DAILY. 90 tablet 2  . busPIRone (BUSPAR) 5 MG tablet Take 1 tablet (5 mg total) by mouth 3 (three) times daily. 90 tablet 2  . cetirizine (ZYRTEC) 10 MG tablet Take 10 mg by mouth daily as needed for allergies.    Marland Kitchen estradiol (CLIMARA - DOSED IN MG/24 HR) 0.1 mg/24hr patch Place 0.1 mg onto the skin once a week.    Marland Kitchen ibuprofen (ADVIL,MOTRIN) 200 MG tablet Take 200 mg by mouth every 6 (six) hours as needed.    . loratadine (CLARITIN) 10 MG tablet Take 10 mg by mouth daily as needed for allergies.    . traZODone (DESYREL) 50 MG tablet Take 0.5-1 tablets (25-50 mg total) by mouth at bedtime as needed for sleep. 90 tablet 3  .  Vitamin D, Ergocalciferol, (DRISDOL) 1.25 MG (50000 UT) CAPS capsule Take 1 capsule (50,000 Units total) by mouth every 7 (seven) days. 4 capsule 0   No current facility-administered medications on file prior to visit.     PAST MEDICAL HISTORY: Past Medical History:  Diagnosis Date  . Allergy   . Anxiety   . Depression   . HLD (hyperlipidemia)   . Joint pain   . Migraines     PAST SURGICAL HISTORY: Past Surgical History:  Procedure Laterality Date  . CESAREAN SECTION WITH BILATERAL  TUBAL LIGATION  005   [facility]  . ENDOMETRIAL ABLATION  2011   menorrhagia,  [place]    SOCIAL HISTORY: Social History   Tobacco Use  . Smoking status: Never Smoker  . Smokeless tobacco: Never Used  Substance Use Topics  . Alcohol use: Yes    Alcohol/week: 2.0 standard drinks    Types: 1 Cans of beer, 1 Glasses of wine per week  . Drug use: No    FAMILY HISTORY: Family History  Problem Relation Age of Onset  . Hypertension Mother   . Hyperlipidemia Mother   . Thyroid disease Mother   . Alcohol abuse Father   . Hypertension Father   . Hyperlipidemia Father   . Depression Father   . Eating disorder Father   . Obesity Father   . Hypertension Maternal Grandmother   . Heart disease Maternal Grandmother   . Prostate cancer Maternal Grandfather   . Heart disease Maternal Grandfather   . Hypertension Maternal Grandfather     ROS: Review of Systems  Psychiatric/Behavioral: The patient is nervous/anxious.     PHYSICAL EXAM: Pt in no acute distress  RECENT LABS AND TESTS: BMET    Component Value Date/Time   NA 137 01/31/2019 0839   K 4.8 01/31/2019 0839   CL 104 01/31/2019 0839   CO2 22 01/31/2019 0839   GLUCOSE 80 01/31/2019 0839   GLUCOSE 80 11/24/2018 1608   BUN 9 01/31/2019 0839   CREATININE 0.65 01/31/2019 0839   CREATININE 0.75 11/24/2018 1608   CALCIUM 9.0 01/31/2019 0839   GFRNONAA 107 01/31/2019 0839   GFRAA 123 01/31/2019 0839   Lab Results  Component Value Date   HGBA1C 5.2 01/31/2019   HGBA1C 5.1 11/24/2018   HGBA1C 5.5 09/30/2017   Lab Results  Component Value Date   INSULIN 7.7 01/31/2019   CBC    Component Value Date/Time   WBC 5.7 01/31/2019 0839   WBC 7.6 11/24/2018 1608   RBC 4.36 01/31/2019 0839   RBC 4.26 11/24/2018 1608   HGB 13.0 01/31/2019 0839   HCT 38.0 01/31/2019 0839   PLT 271 11/24/2018 1608   MCV 87 01/31/2019 0839   MCH 29.8 01/31/2019 0839   MCH 31.0 11/24/2018 1608   MCHC 34.2 01/31/2019 0839   MCHC 35.5  11/24/2018 1608   RDW 12.8 01/31/2019 0839   LYMPHSABS 1.6 01/31/2019 0839   MONOABS 0.6 09/30/2017 0822   EOSABS 0.2 01/31/2019 0839   BASOSABS 0.1 01/31/2019 0839   Iron/TIBC/Ferritin/ %Sat    Component Value Date/Time   IRON 75 11/24/2018 1608   TIBC 283 11/24/2018 1608   FERRITIN 41 11/24/2018 1608   IRONPCTSAT 27 11/24/2018 1608   Lipid Panel     Component Value Date/Time   CHOL 213 (H) 01/31/2019 0839   TRIG 52 01/31/2019 0839   HDL 60 01/31/2019 0839   CHOLHDL 4 09/30/2017 0822   VLDL 16.8 09/30/2017 0822   LDLCALC  143 (H) 01/31/2019 0839   Hepatic Function Panel     Component Value Date/Time   PROT 6.7 01/31/2019 0839   ALBUMIN 4.0 01/31/2019 0839   AST 15 01/31/2019 0839   ALT 16 01/31/2019 0839   ALKPHOS 61 01/31/2019 0839   BILITOT 0.3 01/31/2019 0839      Component Value Date/Time   TSH 1.630 01/31/2019 0839   TSH 1.43 11/24/2018 1608   TSH 1.53 09/30/2017 0822   Results for VAN ADRIENNA, KARIS (MRN [id number]) as of 03/22/2019 07:30  Ref. Range 01/31/2019 08:39  Vitamin D, 25-Hydroxy Latest Ref Range: 30.0 - 100.0 ng/mL 38.7    I, Marcille Blanco, CMA, am acting as transcriptionist for Starlyn Skeans, MD I have reviewed the above documentation for accuracy and completeness, and I agree with the above. -Dennard Nip, MD

## 2019-03-27 ENCOUNTER — Encounter (INDEPENDENT_AMBULATORY_CARE_PROVIDER_SITE_OTHER): Payer: Self-pay | Admitting: Family Medicine

## 2019-04-12 ENCOUNTER — Ambulatory Visit (INDEPENDENT_AMBULATORY_CARE_PROVIDER_SITE_OTHER): Payer: 59 | Admitting: Family Medicine

## 2019-04-16 ENCOUNTER — Ambulatory Visit (INDEPENDENT_AMBULATORY_CARE_PROVIDER_SITE_OTHER): Payer: 59 | Admitting: Family Medicine

## 2019-04-16 ENCOUNTER — Encounter (INDEPENDENT_AMBULATORY_CARE_PROVIDER_SITE_OTHER): Payer: Self-pay | Admitting: Family Medicine

## 2019-04-16 ENCOUNTER — Other Ambulatory Visit: Payer: Self-pay

## 2019-04-16 DIAGNOSIS — Z6831 Body mass index (BMI) 31.0-31.9, adult: Secondary | ICD-10-CM | POA: Diagnosis not present

## 2019-04-16 DIAGNOSIS — E669 Obesity, unspecified: Secondary | ICD-10-CM

## 2019-04-16 DIAGNOSIS — E559 Vitamin D deficiency, unspecified: Secondary | ICD-10-CM | POA: Diagnosis not present

## 2019-04-16 MED ORDER — VITAMIN D (ERGOCALCIFEROL) 1.25 MG (50000 UNIT) PO CAPS: 50000.0000 [IU] | ORAL_CAPSULE | ORAL | 1 refills | Status: DC

## 2019-04-16 MED FILL — VIT D2 1.25 MG (50,000 UNIT: 1.25 MG | 28 days supply | Qty: 4 | Fill #0

## 2019-04-17 NOTE — Progress Notes (Signed)
Office: [phone]  /  Fax: [phone] TeleHealth Visit:  Heather Pratt has verbally consented to this TeleHealth visit today. The patient is located at home, the provider is located at the [facility] office. The participants in this visit include the listed provider and patient. The visit was conducted today via [email or web address].  HPI:   Chief Complaint: OBESITY Heather Pratt is here to discuss her progress with her obesity treatment plan. She is on the Category 2 plan and is following her eating plan approximately 80 % of the time. She states she is exercising with online boot camp, walking, and playing tennis 20 minutes 6 times per week. [name] feels that she has done better with weight loss and thinks that she may have lost 2 pounds in the last month. She is still struggling with meal planning and some family sabotage, as well as stress eating.  We were unable to weigh the patient today for this TeleHealth visit. She feels as if she has lost weight since her last visit. She has lost 4 lbs since starting treatment with [place].  Vitamin D Deficiency Heather Pratt has a diagnosis of vitamin D deficiency. She is currently on prescription vit D, but is not yet at goal. Heather Pratt denies nausea, vomiting, or muscle weakness.  ASSESSMENT AND PLAN:  Vitamin D deficiency - Plan: Vitamin D, Ergocalciferol, (DRISDOL) 1.25 MG (50000 UT) CAPS capsule  Class 1 obesity with serious comorbidity and body mass index (BMI) of 31.0 to 31.9 in adult, unspecified obesity type  PLAN:  Vitamin D Deficiency Heather Pratt was informed that low vitamin D levels contribute to fatigue and are associated with obesity, breast, and colon cancer. Heather Pratt agrees to continue to take prescription Vit D @50 ,000 IU every week #4 with no refills and will follow up for routine testing of vitamin D, at least 2-3 times per year. She was informed of the risk of over-replacement of vitamin D and agrees to not increase her  dose unless she discusses this with [place] first. Heather Pratt agrees to follow up in 2 months as directed.  Obesity Heather Pratt is currently in the action stage of change. As such, her goal is to continue with weight loss efforts. She has agreed to follow the Category 2 plan. Heather Pratt has been instructed to work up to a goal of 150 minutes of combined cardio and strengthening exercise per week for weight loss and overall health benefits. We discussed the following Behavioral Modification Strategies today: increasing lean protein intake, work on meal planning and easy cooking plans, dealing with family or coworker sabotage, and emotional eating strategies.  Heather Pratt has agreed to follow up with our clinic in 6 to 8 weeks. She was informed of the importance of frequent follow up visits to maximize her success with intensive lifestyle modifications for her multiple health conditions.  ALLERGIES: Not on File  MEDICATIONS: Current Outpatient Medications on File Prior to Visit  Medication Sig Dispense Refill   aspirin-acetaminophen-caffeine (EXCEDRIN MIGRAINE) 250-250-65 MG tablet Take 1 tablet by mouth every 6 (six) hours as needed for headache.     buPROPion (WELLBUTRIN XL) 300 MG 24 hr tablet TAKE 1 TABLET BY MOUTH DAILY. 90 tablet 2   busPIRone (BUSPAR) 5 MG tablet Take 1 tablet (5 mg total) by mouth 3 (three) times daily. 90 tablet 2   cetirizine (ZYRTEC) 10 MG tablet Take 10 mg by mouth daily as needed for allergies.     estradiol (CLIMARA - DOSED IN MG/24 HR) 0.1 mg/24hr patch  Place 0.1 mg onto the skin once a week.     ibuprofen (ADVIL,MOTRIN) 200 MG tablet Take 200 mg by mouth every 6 (six) hours as needed.     loratadine (CLARITIN) 10 MG tablet Take 10 mg by mouth daily as needed for allergies.     traZODone (DESYREL) 50 MG tablet Take 0.5-1 tablets (25-50 mg total) by mouth at bedtime as needed for sleep. 90 tablet 3   No current facility-administered medications on file prior to  visit.     PAST MEDICAL HISTORY: Past Medical History:  Diagnosis Date   Allergy    Anxiety    Depression    HLD (hyperlipidemia)    Joint pain    Migraines     PAST SURGICAL HISTORY: Past Surgical History:  Procedure Laterality Date   CESAREAN SECTION WITH BILATERAL TUBAL LIGATION  005   [facility]   ENDOMETRIAL ABLATION  2011   menorrhagia,  [place]    SOCIAL HISTORY: Social History   Tobacco Use   Smoking status: Never Smoker   Smokeless tobacco: Never Used  Substance Use Topics   Alcohol use: Yes    Alcohol/week: 2.0 standard drinks    Types: 1 Cans of beer, 1 Glasses of wine per week   Drug use: No    FAMILY HISTORY: Family History  Problem Relation Age of Onset   Hypertension Mother    Hyperlipidemia Mother    Thyroid disease Mother    Alcohol abuse Father    Hypertension Father    Hyperlipidemia Father    Depression Father    Eating disorder Father    Obesity Father    Hypertension Maternal Grandmother    Heart disease Maternal Grandmother    Prostate cancer Maternal Grandfather    Heart disease Maternal Grandfather    Hypertension Maternal Grandfather     ROS: Review of Systems  Gastrointestinal: Negative for nausea and vomiting.  Musculoskeletal:       Negative for muscle weakness.    PHYSICAL EXAM: Pt in no acute distress  RECENT LABS AND TESTS: BMET    Component Value Date/Time   NA 137 01/31/2019 0839   K 4.8 01/31/2019 0839   CL 104 01/31/2019 0839   CO2 22 01/31/2019 0839   GLUCOSE 80 01/31/2019 0839   GLUCOSE 80 11/24/2018 1608   BUN 9 01/31/2019 0839   CREATININE 0.65 01/31/2019 0839   CREATININE 0.75 11/24/2018 1608   CALCIUM 9.0 01/31/2019 0839   GFRNONAA 107 01/31/2019 0839   GFRAA 123 01/31/2019 0839   Lab Results  Component Value Date   HGBA1C 5.2 01/31/2019   HGBA1C 5.1 11/24/2018   HGBA1C 5.5 09/30/2017   Lab Results  Component Value Date   INSULIN 7.7 01/31/2019   CBC      Component Value Date/Time   WBC 5.7 01/31/2019 0839   WBC 7.6 11/24/2018 1608   RBC 4.36 01/31/2019 0839   RBC 4.26 11/24/2018 1608   HGB 13.0 01/31/2019 0839   HCT 38.0 01/31/2019 0839   PLT 271 11/24/2018 1608   MCV 87 01/31/2019 0839   MCH 29.8 01/31/2019 0839   MCH 31.0 11/24/2018 1608   MCHC 34.2 01/31/2019 0839   MCHC 35.5 11/24/2018 1608   RDW 12.8 01/31/2019 0839   LYMPHSABS 1.6 01/31/2019 0839   MONOABS 0.6 09/30/2017 0822   EOSABS 0.2 01/31/2019 0839   BASOSABS 0.1 01/31/2019 0839   Iron/TIBC/Ferritin/ %Sat    Component Value Date/Time   IRON 75 11/24/2018 1608  TIBC 283 11/24/2018 1608   FERRITIN 41 11/24/2018 1608   IRONPCTSAT 27 11/24/2018 1608   Lipid Panel     Component Value Date/Time   CHOL 213 (H) 01/31/2019 0839   TRIG 52 01/31/2019 0839   HDL 60 01/31/2019 0839   CHOLHDL 4 09/30/2017 0822   VLDL 16.8 09/30/2017 0822   LDLCALC 143 (H) 01/31/2019 0839   Hepatic Function Panel     Component Value Date/Time   PROT 6.7 01/31/2019 0839   ALBUMIN 4.0 01/31/2019 0839   AST 15 01/31/2019 0839   ALT 16 01/31/2019 0839   ALKPHOS 61 01/31/2019 0839   BILITOT 0.3 01/31/2019 0839      Component Value Date/Time   TSH 1.630 01/31/2019 0839   TSH 1.43 11/24/2018 1608   TSH 1.53 09/30/2017 0822   Results for VAN JAICEE, MICHELOTTI (MRN [id number]) as of 04/17/2019 08:38  Ref. Range 01/31/2019 08:39  Vitamin D, 25-Hydroxy Latest Ref Range: 30.0 - 100.0 ng/mL 38.7     I, Marcille Blanco, CMA, am acting as transcriptionist for Starlyn Skeans, MD I have reviewed the above documentation for accuracy and completeness, and I agree with the above. -Dennard Nip, MD

## 2019-05-01 MED FILL — busPIRone HCL 5 MG TABS: 5 | 30 days supply | Qty: 90 | Fill #1

## 2019-05-01 MED FILL — buPROPion HCL ER (XL) 300 M: 300 | 90 days supply | Qty: 90 | Fill #0

## 2019-05-23 MED FILL — VIT D2 1.25 MG (50,000 UNIT: 1.25 MG | 28 days supply | Qty: 4 | Fill #1

## 2019-06-07 ENCOUNTER — Encounter (INDEPENDENT_AMBULATORY_CARE_PROVIDER_SITE_OTHER): Payer: Self-pay | Admitting: Family Medicine

## 2019-06-10 NOTE — Telephone Encounter (Signed)
Please advise 

## 2019-06-12 NOTE — Telephone Encounter (Signed)
Please advise 

## 2019-06-20 MED FILL — ESTRADIOL 0.1 MG/DAY PATCH: 0.1 | 90 days supply | Qty: 4 | Fill #0

## 2019-07-25 ENCOUNTER — Encounter (INDEPENDENT_AMBULATORY_CARE_PROVIDER_SITE_OTHER): Payer: Self-pay

## 2019-07-26 ENCOUNTER — Ambulatory Visit (INDEPENDENT_AMBULATORY_CARE_PROVIDER_SITE_OTHER): Payer: 59 | Admitting: Bariatrics

## 2019-07-26 ENCOUNTER — Other Ambulatory Visit: Payer: Self-pay

## 2019-07-26 ENCOUNTER — Encounter (INDEPENDENT_AMBULATORY_CARE_PROVIDER_SITE_OTHER): Payer: Self-pay | Admitting: Bariatrics

## 2019-07-26 VITALS — BP 134/62 | HR 79 | Temp 98.0°F | Ht 62.0 in | Wt 165.0 lb

## 2019-07-26 DIAGNOSIS — E66811 Obesity, class 1: Secondary | ICD-10-CM

## 2019-07-26 DIAGNOSIS — Z6830 Body mass index (BMI) 30.0-30.9, adult: Secondary | ICD-10-CM | POA: Diagnosis not present

## 2019-07-26 DIAGNOSIS — F418 Other specified anxiety disorders: Secondary | ICD-10-CM

## 2019-07-26 DIAGNOSIS — E669 Obesity, unspecified: Secondary | ICD-10-CM | POA: Diagnosis not present

## 2019-07-26 DIAGNOSIS — E559 Vitamin D deficiency, unspecified: Secondary | ICD-10-CM

## 2019-07-26 DIAGNOSIS — Z9189 Other specified personal risk factors, not elsewhere classified: Secondary | ICD-10-CM

## 2019-07-26 MED ORDER — VITAMIN D (ERGOCALCIFEROL) 1.25 MG (50000 UNIT) PO CAPS: 50000.0000 [IU] | ORAL_CAPSULE | ORAL | 0 refills | Status: DC

## 2019-07-26 MED FILL — VIT D2 1.25 MG (50,000 UNIT: 1.25 MG | 28 days supply | Qty: 4 | Fill #0

## 2019-07-30 NOTE — Progress Notes (Signed)
Office: [phone]  /  Fax: [phone]   HPI:   Chief Complaint: OBESITY Heather Pratt is here to discuss her progress with her obesity treatment plan. She is on the Category 2 plan and is following her eating plan approximately 60 % of the time. She states she is doing Designer, fashion/clothing camp, walking and tennis 30 minutes 3 to 4 times per week. Heather Pratt is down 9 pounds from the last in-house office visit. She is getting in adequate protein. Her weight is 165 lb (74.8 kg) today and has had a weight loss of 9 pounds since her last in-office visit. She has lost 13 lbs since starting treatment with [place].  Vitamin D deficiency Heather Pratt has a diagnosis of vitamin D deficiency. She is currently taking vit D and denies nausea, vomiting or muscle weakness.  At risk for osteopenia and osteoporosis Heather Pratt is at higher risk of osteopenia and osteoporosis due to vitamin D deficiency.   Anxiety with associated depression Heather Pratt has a diagnosis of anxiety associated with depression and she is taking Wellbutrin per her PCP. She shows no sign of suicidal or homicidal ideations.  ASSESSMENT AND PLAN:  Vitamin D deficiency - Plan: Vitamin D, Ergocalciferol, (DRISDOL) 1.25 MG (50000 UT) CAPS capsule  Depression with anxiety  At risk for osteoporosis  Class 1 obesity with serious comorbidity and body mass index (BMI) of 30.0 to 30.9 in adult, unspecified obesity type  PLAN:  Vitamin D Deficiency Heather Pratt was informed that low vitamin D levels contributes to fatigue and are associated with obesity, breast, and colon cancer. She agrees to continue to take prescription Vit D @50 ,000 IU every week #4 with no refills and she will follow up for routine testing of vitamin D, at least 2-3 times per year. She was informed of the risk of over-replacement of vitamin D and agrees to not increase her dose unless she discusses this with [place] first. Heather Pratt agrees to follow up as directed.  At risk for  osteopenia and osteoporosis Heather Pratt was given extended  (15 minutes) osteoporosis prevention counseling today. Heather Pratt is at risk for osteopenia and osteoporosis due to her vitamin D deficiency. She was encouraged to take her vitamin D and follow her higher calcium diet and increase strengthening exercise to help strengthen her bones and decrease her risk of osteopenia and osteoporosis.  Anxiety with associated depression Heather Pratt will continue to take Wellbutrin XL 300 mg daily and she will follow up as directed.  Obesity Heather Pratt is currently in the action stage of change. As such, her goal is to continue with weight loss efforts She has agreed to alternate between the Category 2 plan and the Pescatarian eating plan Heather Pratt will continue exercise (variety in exercise) for weight loss and overall health benefits. We discussed the following Behavioral Modification Strategies today: planning for success, increase H2O intake, no skipping meals, keeping healthy foods in the home, increasing lean protein intake, eat protein first, decreasing simple carbohydrates, increasing vegetables, decrease eating out and work on meal planning and intentional eating  Heather Pratt has agreed to follow up with our clinic in 2 weeks fasting. She was informed of the importance of frequent follow up visits to maximize her success with intensive lifestyle modifications for her multiple health conditions.  ALLERGIES: Not on File  MEDICATIONS: Current Outpatient Medications on File Prior to Visit  Medication Sig Dispense Refill  . aspirin-acetaminophen-caffeine (EXCEDRIN MIGRAINE) 250-250-65 MG tablet Take 1 tablet by mouth every 6 (six) hours as needed for headache.    Marland Kitchen  buPROPion (WELLBUTRIN XL) 300 MG 24 hr tablet TAKE 1 TABLET BY MOUTH DAILY. 90 tablet 2  . busPIRone (BUSPAR) 5 MG tablet Take 1 tablet (5 mg total) by mouth 3 (three) times daily. 90 tablet 2  . cetirizine (ZYRTEC) 10 MG tablet Take 10 mg by  mouth daily as needed for allergies.    Marland Kitchen estradiol (CLIMARA - DOSED IN MG/24 HR) 0.1 mg/24hr patch Place 0.1 mg onto the skin once a week.    Marland Kitchen ibuprofen (ADVIL,MOTRIN) 200 MG tablet Take 200 mg by mouth every 6 (six) hours as needed.    . loratadine (CLARITIN) 10 MG tablet Take 10 mg by mouth daily as needed for allergies.    . traZODone (DESYREL) 50 MG tablet Take 0.5-1 tablets (25-50 mg total) by mouth at bedtime as needed for sleep. 90 tablet 3   No current facility-administered medications on file prior to visit.     PAST MEDICAL HISTORY: Past Medical History:  Diagnosis Date  . Allergy   . Anxiety   . Depression   . HLD (hyperlipidemia)   . Joint pain   . Migraines     PAST SURGICAL HISTORY: Past Surgical History:  Procedure Laterality Date  . CESAREAN SECTION WITH BILATERAL TUBAL LIGATION  005   [facility]  . ENDOMETRIAL ABLATION  2011   menorrhagia,  [place]    SOCIAL HISTORY: Social History   Tobacco Use  . Smoking status: Never Smoker  . Smokeless tobacco: Never Used  Substance Use Topics  . Alcohol use: Yes    Alcohol/week: 2.0 standard drinks    Types: 1 Cans of beer, 1 Glasses of wine per week  . Drug use: No    FAMILY HISTORY: Family History  Problem Relation Age of Onset  . Hypertension Mother   . Hyperlipidemia Mother   . Thyroid disease Mother   . Alcohol abuse Father   . Hypertension Father   . Hyperlipidemia Father   . Depression Father   . Eating disorder Father   . Obesity Father   . Hypertension Maternal Grandmother   . Heart disease Maternal Grandmother   . Prostate cancer Maternal Grandfather   . Heart disease Maternal Grandfather   . Hypertension Maternal Grandfather     ROS: Review of Systems  Constitutional: Positive for weight loss.  Gastrointestinal: Negative for nausea and vomiting.  Musculoskeletal:       Negative for muscle weakness  Psychiatric/Behavioral: Positive for depression. The patient is nervous/anxious.      PHYSICAL EXAM: Blood pressure 134/62, pulse 79, temperature 98 F (36.7 C), temperature source Oral, height 5' 2"  (1.575 m), weight 165 lb (74.8 kg), SpO2 100 %. Body mass index is 30.18 kg/m. Physical Exam Vitals signs reviewed.  Constitutional:      Appearance: Normal appearance. She is well-developed. She is obese.  Cardiovascular:     Rate and Rhythm: Normal rate.  Pulmonary:     Effort: Pulmonary effort is normal.  Musculoskeletal: Normal range of motion.  Skin:    General: Skin is warm and dry.  Neurological:     Mental Status: She is alert and oriented to person, place, and time.  Psychiatric:        Mood and Affect: Mood normal.        Behavior: Behavior normal.        Thought Content: Thought content does not include homicidal or suicidal ideation.     RECENT LABS AND TESTS: BMET    Component Value Date/Time  NA 137 01/31/2019 0839   K 4.8 01/31/2019 0839   CL 104 01/31/2019 0839   CO2 22 01/31/2019 0839   GLUCOSE 80 01/31/2019 0839   GLUCOSE 80 11/24/2018 1608   BUN 9 01/31/2019 0839   CREATININE 0.65 01/31/2019 0839   CREATININE 0.75 11/24/2018 1608   CALCIUM 9.0 01/31/2019 0839   GFRNONAA 107 01/31/2019 0839   GFRAA 123 01/31/2019 0839   Lab Results  Component Value Date   HGBA1C 5.2 01/31/2019   HGBA1C 5.1 11/24/2018   HGBA1C 5.5 09/30/2017   Lab Results  Component Value Date   INSULIN 7.7 01/31/2019   CBC    Component Value Date/Time   WBC 5.7 01/31/2019 0839   WBC 7.6 11/24/2018 1608   RBC 4.36 01/31/2019 0839   RBC 4.26 11/24/2018 1608   HGB 13.0 01/31/2019 0839   HCT 38.0 01/31/2019 0839   PLT 271 11/24/2018 1608   MCV 87 01/31/2019 0839   MCH 29.8 01/31/2019 0839   MCH 31.0 11/24/2018 1608   MCHC 34.2 01/31/2019 0839   MCHC 35.5 11/24/2018 1608   RDW 12.8 01/31/2019 0839   LYMPHSABS 1.6 01/31/2019 0839   MONOABS 0.6 09/30/2017 0822   EOSABS 0.2 01/31/2019 0839   BASOSABS 0.1 01/31/2019 0839   Iron/TIBC/Ferritin/ %Sat     Component Value Date/Time   IRON 75 11/24/2018 1608   TIBC 283 11/24/2018 1608   FERRITIN 41 11/24/2018 1608   IRONPCTSAT 27 11/24/2018 1608   Lipid Panel     Component Value Date/Time   CHOL 213 (H) 01/31/2019 0839   TRIG 52 01/31/2019 0839   HDL 60 01/31/2019 0839   CHOLHDL 4 09/30/2017 0822   VLDL 16.8 09/30/2017 0822   LDLCALC 143 (H) 01/31/2019 0839   Hepatic Function Panel     Component Value Date/Time   PROT 6.7 01/31/2019 0839   ALBUMIN 4.0 01/31/2019 0839   AST 15 01/31/2019 0839   ALT 16 01/31/2019 0839   ALKPHOS 61 01/31/2019 0839   BILITOT 0.3 01/31/2019 0839      Component Value Date/Time   TSH 1.630 01/31/2019 0839   TSH 1.43 11/24/2018 1608   TSH 1.53 09/30/2017 0822     Ref. Range 01/31/2019 08:39  Vitamin D, 25-Hydroxy Latest Ref Range: 30.0 - 100.0 ng/mL 38.7    OBESITY BEHAVIORAL INTERVENTION VISIT  Today's visit was # 6   Starting weight: 178 lbs Starting date: 01/31/2019 Today's weight : 165 lbs Today's date: 07/26/2019 Total lbs lost to date: 13    07/26/2019  Height 5' 2"  (1.575 m)  Weight 165 lb (74.8 kg)  BMI (Calculated) 30.17  BLOOD PRESSURE - SYSTOLIC 702  BLOOD PRESSURE - DIASTOLIC 62   Body Fat % 63.7 %  Total Body Water (lbs) 72 lbs    ASK: We discussed the diagnosis of obesity with [place] today and Yaqueline agreed to give [place] permission to discuss obesity behavioral modification therapy today.  ASSESS: Jaquanda has the diagnosis of obesity and her BMI today is 30.17 Hazelene is in the action stage of change   ADVISE: Tachina was educated on the multiple health risks of obesity as well as the benefit of weight loss to improve her health. She was advised of the need for long term treatment and the importance of lifestyle modifications to improve her current health and to decrease her risk of future health problems.  AGREE: Multiple dietary modification options and treatment options were discussed and   Benjamine Mola agreed  to follow the recommendations documented in the above note.  ARRANGE: Khila was educated on the importance of frequent visits to treat obesity as outlined per CMS and USPSTF guidelines and agreed to schedule her next follow up appointment today.  Corey Skains, am acting as Location manager for [employer]. Owens Shark, DO  I have reviewed the above documentation for accuracy and completeness, and I agree with the above. -Jearld Lesch, DO

## 2019-07-31 ENCOUNTER — Encounter (INDEPENDENT_AMBULATORY_CARE_PROVIDER_SITE_OTHER): Payer: Self-pay | Admitting: Bariatrics

## 2019-08-07 ENCOUNTER — Other Ambulatory Visit: Payer: Self-pay | Admitting: Internal Medicine

## 2019-08-07 MED FILL — buPROPion HCL ER (XL) 300 M: 300 | 90 days supply | Qty: 90 | Fill #0

## 2019-08-07 MED FILL — busPIRone HCL 5 MG TABS: 5 | 30 days supply | Qty: 90 | Fill #2

## 2019-08-22 ENCOUNTER — Ambulatory Visit (INDEPENDENT_AMBULATORY_CARE_PROVIDER_SITE_OTHER): Payer: 59 | Admitting: Bariatrics

## 2019-10-05 MED FILL — ESTRADIOL 0.1 MG/DAY PATCH: 0.1 | 90 days supply | Qty: 4 | Fill #0

## 2019-11-05 ENCOUNTER — Other Ambulatory Visit: Payer: Self-pay | Admitting: Internal Medicine

## 2019-11-05 MED FILL — busPIRone HCL 5 MG TABS: 5 | 30 days supply | Qty: 90 | Fill #0

## 2019-11-05 MED FILL — BUPROPION HCL ER (XL) 300 M: 300 | 30 days supply | Qty: 30 | Fill #0

## 2019-11-20 ENCOUNTER — Encounter: Payer: 59 | Admitting: Internal Medicine

## 2019-12-06 ENCOUNTER — Encounter: Payer: Self-pay | Admitting: Internal Medicine

## 2019-12-06 ENCOUNTER — Ambulatory Visit (INDEPENDENT_AMBULATORY_CARE_PROVIDER_SITE_OTHER): Payer: 59 | Admitting: Internal Medicine

## 2019-12-06 ENCOUNTER — Other Ambulatory Visit: Payer: Self-pay

## 2019-12-06 VITALS — Ht 62.0 in | Wt 165.0 lb

## 2019-12-06 DIAGNOSIS — F418 Other specified anxiety disorders: Secondary | ICD-10-CM | POA: Diagnosis not present

## 2019-12-06 DIAGNOSIS — Z0000 Encounter for general adult medical examination without abnormal findings: Secondary | ICD-10-CM

## 2019-12-06 DIAGNOSIS — Z1151 Encounter for screening for human papillomavirus (HPV): Secondary | ICD-10-CM | POA: Diagnosis not present

## 2019-12-06 DIAGNOSIS — E669 Obesity, unspecified: Secondary | ICD-10-CM

## 2019-12-06 DIAGNOSIS — R5383 Other fatigue: Secondary | ICD-10-CM

## 2019-12-06 DIAGNOSIS — E785 Hyperlipidemia, unspecified: Secondary | ICD-10-CM

## 2019-12-06 DIAGNOSIS — Z113 Encounter for screening for infections with a predominantly sexual mode of transmission: Secondary | ICD-10-CM | POA: Diagnosis not present

## 2019-12-06 DIAGNOSIS — Z6831 Body mass index (BMI) 31.0-31.9, adult: Secondary | ICD-10-CM | POA: Diagnosis not present

## 2019-12-06 DIAGNOSIS — Z01419 Encounter for gynecological examination (general) (routine) without abnormal findings: Secondary | ICD-10-CM | POA: Diagnosis not present

## 2019-12-06 DIAGNOSIS — Z1231 Encounter for screening mammogram for malignant neoplasm of breast: Secondary | ICD-10-CM | POA: Diagnosis not present

## 2019-12-06 LAB — HM MAMMOGRAPHY

## 2019-12-06 MED ORDER — TRAZODONE HCL 50 MG PO TABS: 25.0000 mg | ORAL_TABLET | Freq: Every evening | ORAL | 3 refills | Status: DC | PRN

## 2019-12-06 NOTE — Progress Notes (Signed)
Virtual Visit via [email or web address]  This visit type was conducted due to national recommendations for restrictions regarding the COVID-19 pandemic (e.g. social distancing).  This format is felt to be most appropriate for this patient at this time.  All issues noted in this document were discussed and addressed.  No physical exam was performed (except for noted visual exam findings with Video Visits).   I connected with@ on 12/06/19 at  8:00 AM EST by a video enabled telemedicine application and verified that I am speaking with the correct person using two identifiers. Location patient: home Location provider: work or home office Persons participating in the virtual visit: patient, provider  I discussed the limitations, risks, security and privacy concerns of performing an evaluation and management service by telephone and the availability of in person appointments. I also discussed with the patient that there may be a patient responsible charge related to this service. The patient expressed understanding and agreed to proceed.  Reason for visit: annual preventive exam   HPI:  48 yr old female with history of GAD/depression , managed with wellbutrin,  Buspirone and trazodone  ,  Last seen by me   one year ago   Obesity:  Has achieved a 17 lb weight loss since Oct 2019 with dr Leafy Ro.  No longer seeing her,  Following a high protein diet with reduced portion  sizes and exercising regularly'  GAD:  Using trazodone about 3 nights per month,  Using wellbutrin daily and buspirone prn .   ROS: See pertinent positives and negatives per HPI.  Past Medical History:  Diagnosis Date  . Allergy   . Anxiety   . Depression   . HLD (hyperlipidemia)   . Joint pain   . Migraines     Past Surgical History:  Procedure Laterality Date  . CESAREAN SECTION WITH BILATERAL TUBAL LIGATION  005   [facility]  . ENDOMETRIAL ABLATION  2011   [place]orrhagia,  [place]    Family History  Problem Relation Age of Onset   . Hypertension Mother   . Hyperlipidemia Mother   . Thyroid disease Mother   . Alcohol abuse Father   . Hypertension Father   . Hyperlipidemia Father   . Depression Father   . Eating disorder Father   . Obesity Father   . Hypertension Maternal Grandmother   . Heart disease Maternal Grandmother   . Prostate cancer Maternal Grandfather   . Heart disease Maternal Grandfather   . Hypertension Maternal Grandfather     SOCIAL HX:  reports that she has never smoked. She has never used smokeless tobacco. She reports current alcohol use of about 2.0 standard drinks of alcohol per week. She reports that she does not use drugs.   Current Outpatient Medications:  .  aspirin-acetaminophen-caffeine (EXCEDRIN MIGRAINE) 250-250-65 MG tablet, Take 1 tablet by mouth every 6 (six) hours as needed for headache., Disp: , Rfl:  .  buPROPion (WELLBUTRIN XL) 300 MG 24 hr tablet, TAKE 1 TABLET BY MOUTH DAILY., Disp: 90 tablet, Rfl: 1 .  busPIRone (BUSPAR) 5 MG tablet, TAKE 1 TABLET BY MOUTH THREE TIMES DAILY, Disp: 90 tablet, Rfl: 2 .  cetirizine (ZYRTEC) 10 MG tablet, Take 10 mg by mouth daily as needed for allergies., Disp: , Rfl:  .  cholecalciferol (VITAMIN D3) 25 MCG (1000 UT) tablet, Take 1,000 Units by mouth daily., Disp: , Rfl:  .  estradiol (CLIMARA - DOSED IN MG/24 HR) 0.1 mg/24hr patch, Place 0.1 mg onto the skin once a  week., Disp: , Rfl:  .  ibuprofen (ADVIL,MOTRIN) 200 MG tablet, Take 200 mg by mouth every 6 (six) hours as needed., Disp: , Rfl:  .  loratadine (CLARITIN) 10 MG tablet, Take 10 mg by mouth daily as needed for allergies., Disp: , Rfl:  .  traZODone (DESYREL) 50 MG tablet, Take 0.5-1 tablets (25-50 mg total) by mouth at bedtime as needed for sleep., Disp: 90 tablet, Rfl: 3  EXAM:  VITALS per patient if applicable:  GENERAL: alert, oriented, appears well and in no acute distress  HEENT: atraumatic, conjunttiva clear, no obvious abnormalities on inspection of external nose and  ears  NECK: normal movements of the head and neck  LUNGS: on inspection no signs of respiratory distress, breathing rate appears normal, no obvious gross SOB, gasping or wheezing  CV: no obvious cyanosis  MS: moves all visible extremities without noticeable abnormality  PSYCH/NEURO: pleasant and cooperative, no obvious depression or anxiety, speech and thought processing grossly intact  ASSESSMENT AND PLAN:  Discussed the following assessment and plan:  Fatigue, unspecified type - Plan: Comprehensive metabolic panel, TSH, CBC with Differential/Platelet  Screen for STD (sexually transmitted disease) - Plan: HIV antibody  Visit for preventive health examination  Hyperlipidemia LDL goal <160 - Plan: Lipid panel  Anxiety associated with depression  Obesity (BMI 30.0-34.9)  Encounter for preventive health examination  Anxiety associated with depression Well controlled on current regimen. no changes today.  Obesity (BMI 30.0-34.9) I have congratulated her in reduction of   BMI and encouraged  Continued weight loss with goal of 10% of body weigh over the next 6 months using a low glycemic index diet and regular exercise a minimum of 5 days per week.    Encounter for preventive health examination age appropriate education and counseling updated, referrals for preventative services and immunizations addressed, dietary and smoking counseling addressed, most recent labs reviewed.  I have personally reviewed and have noted:  1) the patient's medical and social history 2) The pt's use of alcohol, tobacco, and illicit drugs 3) The patient's current medications and supplements 4) Functional ability including ADL's, fall risk, home safety risk, hearing and visual impairment 5) Diet and physical activities 6) Evidence for depression or mood disorder 7) The patient's height, weight, and BMI have been recorded in the chart  I have made referrals, and provided counseling and education  based on review of the above    I discussed the assessment and treatment plan with the patient. The patient was provided an opportunity to ask questions and all were answered. The patient agreed with the plan and demonstrated an understanding of the instructions.   The patient was advised to call back or seek an in-person evaluation if the symptoms worsen or if the condition fails to improve as anticipated.  I provided 25 minutes of non-face-to-face time during this encounter.   Crecencio Mc, MD

## 2019-12-09 ENCOUNTER — Encounter: Payer: Self-pay | Admitting: Internal Medicine

## 2019-12-09 NOTE — Assessment & Plan Note (Signed)

## 2019-12-09 NOTE — Assessment & Plan Note (Signed)
Well controlled on current regimen.  no changes today.   

## 2019-12-09 NOTE — Assessment & Plan Note (Signed)
I have congratulated her in reduction of   BMI and encouraged  Continued weight loss with goal of 10% of body weigh over the next 6 months using a low glycemic index diet and regular exercise a minimum of 5 days per week.

## 2019-12-10 ENCOUNTER — Other Ambulatory Visit (INDEPENDENT_AMBULATORY_CARE_PROVIDER_SITE_OTHER): Payer: 59

## 2019-12-10 ENCOUNTER — Other Ambulatory Visit: Payer: Self-pay

## 2019-12-10 DIAGNOSIS — E785 Hyperlipidemia, unspecified: Secondary | ICD-10-CM

## 2019-12-10 DIAGNOSIS — R5383 Other fatigue: Secondary | ICD-10-CM | POA: Diagnosis not present

## 2019-12-10 DIAGNOSIS — Z113 Encounter for screening for infections with a predominantly sexual mode of transmission: Secondary | ICD-10-CM | POA: Diagnosis not present

## 2019-12-10 MED FILL — BUPROPION HCL XL 300 MG TAB: 300 | 30 days supply | Qty: 30 | Fill #1

## 2019-12-11 LAB — CBC WITH DIFFERENTIAL/PLATELET
Basophils Absolute: 0.1 10*3/uL (ref 0.0–0.1)
Basophils Relative: 0.9 % (ref 0.0–3.0)
Eosinophils Absolute: 0.3 10*3/uL (ref 0.0–0.7)
Eosinophils Relative: 3.2 % (ref 0.0–5.0)
HCT: 38 % (ref 36.0–46.0)
Hemoglobin: 12.5 g/dL (ref 12.0–15.0)
Lymphocytes Relative: 27.3 % (ref 12.0–46.0)
Lymphs Abs: 2.2 10*3/uL (ref 0.7–4.0)
MCHC: 32.8 g/dL (ref 30.0–36.0)
MCV: 89.3 fl (ref 78.0–100.0)
Monocytes Absolute: 0.6 10*3/uL (ref 0.1–1.0)
Monocytes Relative: 7.7 % (ref 3.0–12.0)
Neutro Abs: 5 10*3/uL (ref 1.4–7.7)
Neutrophils Relative %: 60.9 % (ref 43.0–77.0)
Platelets: 271 10*3/uL (ref 150.0–400.0)
RBC: 4.26 Mil/uL (ref 3.87–5.11)
RDW: 14 % (ref 11.5–15.5)
WBC: 8.2 10*3/uL (ref 4.0–10.5)

## 2019-12-11 LAB — COMPREHENSIVE METABOLIC PANEL
ALT: 17 U/L (ref 0–35)
AST: 19 U/L (ref 0–37)
Albumin: 3.8 g/dL (ref 3.5–5.2)
Alkaline Phosphatase: 52 U/L (ref 39–117)
BUN: 12 mg/dL (ref 6–23)
CO2: 25 mEq/L (ref 19–32)
Calcium: 8.8 mg/dL (ref 8.4–10.5)
Chloride: 104 mEq/L (ref 96–112)
Creatinine, Ser: 0.77 mg/dL (ref 0.40–1.20)
GFR: 80.11 mL/min (ref >60.00)
Glucose, Bld: 79 mg/dL (ref 70–99)
Potassium: 3.7 mEq/L (ref 3.5–5.1)
Sodium: 137 mEq/L (ref 135–145)
Total Bilirubin: 0.4 mg/dL (ref 0.2–1.2)
Total Protein: 6.5 g/dL (ref 6.0–8.3)

## 2019-12-11 LAB — LIPID PANEL
Cholesterol: 224 mg/dL — ABNORMAL HIGH (ref 0–200)
HDL: 54.9 mg/dL (ref >39.00)
LDL Cholesterol: 156 mg/dL — ABNORMAL HIGH (ref 0–99)
NonHDL: 168.86
Total CHOL/HDL Ratio: 4
Triglycerides: 64 mg/dL (ref 0.0–149.0)
VLDL: 12.8 mg/dL (ref 0.0–40.0)

## 2019-12-11 LAB — TSH: TSH: 1.76 u[IU]/mL (ref 0.35–4.50)

## 2019-12-11 LAB — HIV ANTIBODY (ROUTINE TESTING W REFLEX): HIV 1&2 Ab, 4th Generation: NONREACTIVE

## 2019-12-14 ENCOUNTER — Encounter: Payer: Self-pay | Admitting: *Deleted

## 2020-01-01 MED FILL — ESTRADIOL 0.1 MG/DAY PATCH: 0.1 | 88 days supply | Qty: 4 | Fill #0

## 2020-01-03 MED FILL — BUPROPION HCL XL 300 MG TAB: 300 | 30 days supply | Qty: 30 | Fill #2

## 2020-02-02 MED FILL — BuPROPion HCL ER (XL) 300 M: 300 | 30 days supply | Qty: 30 | Fill #3

## 2020-02-20 MED FILL — traZODone HCL 50 MG TABS: 50 | 90 days supply | Qty: 90 | Fill #0

## 2020-03-03 MED FILL — buPROPion HCL ER (XL) 300 M: 300 | 60 days supply | Qty: 60 | Fill #4

## 2020-04-24 ENCOUNTER — Ambulatory Visit (INDEPENDENT_AMBULATORY_CARE_PROVIDER_SITE_OTHER): Payer: 59 | Admitting: Internal Medicine

## 2020-04-24 ENCOUNTER — Encounter: Payer: Self-pay | Admitting: Internal Medicine

## 2020-04-24 ENCOUNTER — Other Ambulatory Visit: Payer: Self-pay

## 2020-04-24 VITALS — BP 122/80 | HR 81 | Temp 97.7°F | Resp 14 | Ht 62.0 in | Wt 174.6 lb

## 2020-04-24 DIAGNOSIS — R300 Dysuria: Secondary | ICD-10-CM | POA: Diagnosis not present

## 2020-04-24 DIAGNOSIS — F330 Major depressive disorder, recurrent, mild: Secondary | ICD-10-CM | POA: Diagnosis not present

## 2020-04-24 DIAGNOSIS — N3090 Cystitis, unspecified without hematuria: Secondary | ICD-10-CM

## 2020-04-24 LAB — POCT URINALYSIS DIPSTICK
Bilirubin, UA: NEGATIVE
Blood, UA: NEGATIVE
Glucose, UA: NEGATIVE
Ketones, UA: NEGATIVE
Leukocytes, UA: NEGATIVE
Nitrite, UA: NEGATIVE
Protein, UA: NEGATIVE
Spec Grav, UA: 1.015 (ref 1.010–1.025)
Urobilinogen, UA: 0.2 E.U./dL
pH, UA: 6 (ref 5.0–8.0)

## 2020-04-24 LAB — URINALYSIS, MICROSCOPIC ONLY: RBC / HPF: NONE SEEN (ref 0–?)

## 2020-04-24 MED ORDER — NITROFURANTOIN MONOHYD MACRO 100 MG PO CAPS
100.0000 mg | ORAL_CAPSULE | Freq: Two times a day (BID) | ORAL | 0 refills | Status: DC
Start: 2020-04-24 — End: 2020-06-05

## 2020-04-24 NOTE — Patient Instructions (Signed)
Start macrobid twice daily   For minimum of 5 days,  7 if symptoms persist  If culture is negative and symptoms resolve,  No further workup needed   If symptoms do not resolve  Let me know   Buspirone can be increased by 2.5 mg every 2 weeks if needed

## 2020-04-24 NOTE — Progress Notes (Signed)
Subjective:  Patient ID: Heather Pratt, female    DOB: [birth year]  Age: 49 y.o. MRN: [id number]  CC: The primary encounter diagnosis was Dysuria. Diagnoses of Cystitis and Mild episode of recurrent major depressive disorder ([place]) were also pertinent to this visit.  HPI [name] presents for evaluation of urinary frequency  This visit occurred during the SARS-CoV-2 public health emergency.  Safety protocols were in place, including screening questions prior to the visit, additional usage of staff PPE, and extensive cleaning of exam room while observing appropriate contact time as indicated for disinfecting solutions.    Patient has received both doses of the available COVID 19 vaccine without complications.  Patient continues to mask when outside of the home except when walking in yard or at safe distances from others .  Patient denies any change in mood or development of unhealthy behaviors resuting from the pandemic's restriction of activities and socialization.  However she has been more worried about her youngest son, who is failing his high school classes due to lack of motivation and apparent apathy.   Patient reports increased frequency  And dysuria for the past 2 weeks  symptoms have been mild and intermittent. .  Not present today.  Has increased her water intake .  No recent sex,  swimming or prior UTI.  No flank pain , hematuria or nausea.      Outpatient Medications Prior to Visit  Medication Sig Dispense Refill  . aspirin-acetaminophen-caffeine (EXCEDRIN MIGRAINE) 250-250-65 MG tablet Take 1 tablet by mouth every 6 (six) hours as needed for headache.    Marland Kitchen buPROPion (WELLBUTRIN XL) 300 MG 24 hr tablet TAKE 1 TABLET BY MOUTH DAILY. 90 tablet 1  . busPIRone (BUSPAR) 5 MG tablet TAKE 1 TABLET BY MOUTH THREE TIMES DAILY 90 tablet 2  . cetirizine (ZYRTEC) 10 MG tablet Take 10 mg by mouth daily as needed for allergies.    . cholecalciferol (VITAMIN D3) 25 MCG (1000  UT) tablet Take 1,000 Units by mouth daily.    Marland Kitchen estradiol (CLIMARA - DOSED IN MG/24 HR) 0.1 mg/24hr patch Place 0.1 mg onto the skin once a week.    Marland Kitchen ibuprofen (ADVIL,MOTRIN) 200 MG tablet Take 200 mg by mouth every 6 (six) hours as needed.    . loratadine (CLARITIN) 10 MG tablet Take 10 mg by mouth daily as needed for allergies.    . traZODone (DESYREL) 50 MG tablet Take 0.5-1 tablets (25-50 mg total) by mouth at bedtime as needed for sleep. 90 tablet 3   No facility-administered medications prior to visit.    Review of Systems;  Patient denies headache, fevers, malaise, unintentional weight loss, skin rash, eye pain, sinus congestion and sinus pain, sore throat, dysphagia,  hemoptysis , cough, dyspnea, wheezing, chest pain, palpitations, orthopnea, edema, abdominal pain, nausea, melena, diarrhea, constipation, flank pain, dysuria, hematuria, urinary  Frequency, nocturia, numbness, tingling, seizures,  Focal weakness, Loss of consciousness,  Tremor, insomnia, depression, anxiety, and suicidal ideation.      Objective:  BP 122/80 (BP Location: Left Arm, Patient Position: Sitting, Cuff Size: Normal)   Pulse 81   Temp 97.7 F (36.5 C) (Temporal)   Resp 14   Ht 5' 2"  (1.575 m)   Wt 174 lb 9.6 oz (79.2 kg)   SpO2 98%   BMI 31.93 kg/m   BP Readings from Last 3 Encounters:  04/24/20 122/80  07/26/19 134/62  02/14/19 112/67    Wt Readings from Last 3 Encounters:  04/24/20 174 lb 9.6 oz (79.2 kg)  12/06/19 165 lb (74.8 kg)  07/26/19 165 lb (74.8 kg)    General appearance: alert, cooperative and appears stated age Ears: normal TM's and external ear canals both ears Throat: lips, mucosa, and tongue normal; teeth and gums normal Neck: no adenopathy, no carotid bruit, supple, symmetrical, trachea midline and thyroid not enlarged, symmetric, no tenderness/mass/nodules Back: symmetric, no curvature. ROM normal. No CVA tenderness. Lungs: clear to auscultation bilaterally Heart:  regular rate and rhythm, S1, S2 normal, no murmur, click, rub or gallop Abdomen: soft, non-tender; bowel sounds normal; no masses,  no organomegaly Pulses: 2+ and symmetric Skin: Skin color, texture, turgor normal. No rashes or lesions Lymph nodes: Cervical, supraclavicular, and axillary nodes normal.  Lab Results  Component Value Date   HGBA1C 5.2 01/31/2019   HGBA1C 5.1 11/24/2018   HGBA1C 5.5 09/30/2017    Lab Results  Component Value Date   CREATININE 0.77 12/10/2019   CREATININE 0.65 01/31/2019   CREATININE 0.75 11/24/2018    Lab Results  Component Value Date   WBC 8.2 12/10/2019   HGB 12.5 12/10/2019   HCT 38.0 12/10/2019   PLT 271.0 12/10/2019   GLUCOSE 79 12/10/2019   CHOL 224 (H) 12/10/2019   TRIG 64.0 12/10/2019   HDL 54.90 12/10/2019   LDLCALC 156 (H) 12/10/2019   ALT 17 12/10/2019   AST 19 12/10/2019   NA 137 12/10/2019   K 3.7 12/10/2019   CL 104 12/10/2019   CREATININE 0.77 12/10/2019   BUN 12 12/10/2019   CO2 25 12/10/2019   TSH 1.76 12/10/2019   HGBA1C 5.2 01/31/2019    Assessment & Plan:   Problem List Items Addressed This Visit      Unprioritized   Cystitis    UA and micro are normal.  Culture grew mixed flora and Group B strep. History suggests partially treated UTI (managed with cranberry and water) and she was given rx for macrobid . If symptoms return, will treat for GBS with PCN      Major depressive disorder with current active episode    Symptoms of depression are managed with wellbutirn,  And anxiety managed with buspirone.  Recent stressors/triggers discussed.  No changes today        Other Visit Diagnoses    Dysuria    -  Primary   Relevant Orders   POCT Urinalysis Dipstick (Completed)   Urine Microscopic Only (Completed)   Urine Culture (Completed)      I am having Suly B. Lucianne Lei Fleet start on nitrofurantoin (macrocrystal-monohydrate). I am also having her maintain her estradiol, loratadine, cetirizine,  aspirin-acetaminophen-caffeine, ibuprofen, buPROPion, busPIRone, cholecalciferol, and traZODone.  Meds ordered this encounter  Medications  . nitrofurantoin, macrocrystal-monohydrate, (MACROBID) 100 MG capsule    Sig: Take 1 capsule (100 mg total) by mouth 2 (two) times daily.    Dispense:  14 capsule    Refill:  0   I provided  30 minutes of  face-to-face time during this encounter reviewing patient's current problems and past surgeries, labs and imaging studies, providing counseling on the above mentioned problems , and coordination  of care .  There are no discontinued medications.  Follow-up: No follow-ups on file.   Crecencio Mc, MD

## 2020-04-26 DIAGNOSIS — N3090 Cystitis, unspecified without hematuria: Secondary | ICD-10-CM | POA: Insufficient documentation

## 2020-04-26 LAB — URINE CULTURE
MICRO NUMBER:: 10500971
SPECIMEN QUALITY:: ADEQUATE

## 2020-04-26 NOTE — Assessment & Plan Note (Signed)
Symptoms of depression are managed with wellbutirn,  And anxiety managed with buspirone.  Recent stressors/triggers discussed.  No changes today

## 2020-04-26 NOTE — Assessment & Plan Note (Signed)
UA and micro are normal.  Culture grew mixed flora and Group B strep. History suggests partially treated UTI (managed with cranberry and water) and she was given rx for macrobid . If symptoms return, will treat for GBS with PCN

## 2020-05-02 ENCOUNTER — Other Ambulatory Visit: Payer: Self-pay | Admitting: Internal Medicine

## 2020-05-02 MED FILL — buPROPion HCL ER (XL) 300 M: 300 | 60 days supply | Qty: 60 | Fill #0

## 2020-06-05 ENCOUNTER — Other Ambulatory Visit: Payer: Self-pay | Admitting: Internal Medicine

## 2020-06-05 ENCOUNTER — Other Ambulatory Visit: Payer: Self-pay

## 2020-06-05 ENCOUNTER — Ambulatory Visit (INDEPENDENT_AMBULATORY_CARE_PROVIDER_SITE_OTHER): Payer: 59 | Admitting: Internal Medicine

## 2020-06-05 ENCOUNTER — Encounter: Payer: Self-pay | Admitting: Internal Medicine

## 2020-06-05 DIAGNOSIS — F330 Major depressive disorder, recurrent, mild: Secondary | ICD-10-CM

## 2020-06-05 DIAGNOSIS — F418 Other specified anxiety disorders: Secondary | ICD-10-CM

## 2020-06-05 MED ORDER — BUSPIRONE HCL 5 MG PO TABS: 10.0000 mg | ORAL_TABLET | Freq: Two times a day (BID) | ORAL | 2 refills | Status: DC

## 2020-06-05 MED ORDER — BUSPIRONE HCL 10 MG PO TABS: 10.0000 mg | ORAL_TABLET | Freq: Two times a day (BID) | ORAL | 1 refills | Status: DC

## 2020-06-05 MED FILL — busPIRone HCL 10 MG TABS: 10 | 90 days supply | Qty: 180 | Fill #0

## 2020-06-05 NOTE — Patient Instructions (Signed)
I HAVE INCREASED THE BUSPIRONE TO 10 MG .  You can take it twice daily instead of 3 times.  You can increase the dose in 5 mg increments as needed to a maximal dose of 30 mg twice daily   Let me know if you need me to help you find a therapist that can do evening virtual visits;  i'll talk to [employer]

## 2020-06-07 NOTE — Progress Notes (Signed)
Subjective:  Patient ID: Heather Pratt, female    DOB: 07-Aug-1971  Age: 49 y.o. MRN: [id number]  CC: Diagnoses of Mild episode of recurrent major depressive disorder ([place]) and Anxiety associated with depression were pertinent to this visit.  HPI Heather Pratt presents for follow up on depression with anxiety.  She was last seen in May and buspirone was added for management of anxiety triggered  By her son's poor performance in school.  She continue to have symptoms of anxiety and fatigue . She cites work stressors as contributory .  She is sleeping with use of trazodone. She feels exhausted most of the time.  Too tired to exercise .  Denies any feelings of despair, low self esteem or failure.  Not suiciddal  Outpatient Medications Prior to Visit  Medication Sig Dispense Refill  . aspirin-acetaminophen-caffeine (EXCEDRIN MIGRAINE) 250-250-65 MG tablet Take 1 tablet by mouth every 6 (six) hours as needed for headache.    Marland Kitchen buPROPion (WELLBUTRIN XL) 300 MG 24 hr tablet TAKE 1 TABLET BY MOUTH DAILY. 60 tablet 1  . cetirizine (ZYRTEC) 10 MG tablet Take 10 mg by mouth daily as needed for allergies.    . cholecalciferol (VITAMIN D3) 25 MCG (1000 UT) tablet Take 1,000 Units by mouth daily.    Marland Kitchen estradiol (CLIMARA - DOSED IN MG/24 HR) 0.1 mg/24hr patch Place 0.1 mg onto the skin once a week.    Marland Kitchen ibuprofen (ADVIL,MOTRIN) 200 MG tablet Take 200 mg by mouth every 6 (six) hours as needed.    . loratadine (CLARITIN) 10 MG tablet Take 10 mg by mouth daily as needed for allergies.    . traZODone (DESYREL) 50 MG tablet Take 0.5-1 tablets (25-50 mg total) by mouth at bedtime as needed for sleep. 90 tablet 3  . busPIRone (BUSPAR) 5 MG tablet TAKE 1 TABLET BY MOUTH THREE TIMES DAILY 90 tablet 2  . nitrofurantoin, macrocrystal-monohydrate, (MACROBID) 100 MG capsule Take 1 capsule (100 mg total) by mouth 2 (two) times daily. (Patient not taking: Reported on 06/05/2020) 14 capsule 0   No  facility-administered medications prior to visit.    Review of Systems;  Patient denies headache, fevers, malaise, unintentional weight loss, skin rash, eye pain, sinus congestion and sinus pain, sore throat, dysphagia,  hemoptysis , cough, dyspnea, wheezing, chest pain, palpitations, orthopnea, edema, abdominal pain, nausea, melena, diarrhea, constipation, flank pain, dysuria, hematuria, urinary  Frequency, nocturia, numbness, tingling, seizures,  Focal weakness, Loss of consciousness,  Tremor, insomnia, depression, anxiety, and suicidal ideation.      Objective:  BP 112/72 (BP Location: Left Arm, Patient Position: Sitting, Cuff Size: Normal)   Pulse 81   Temp 98 F (36.7 C) (Temporal)   Resp 14   Ht 5' 2"  (1.575 m)   Wt 172 lb 6.4 oz (78.2 kg)   SpO2 97%   BMI 31.53 kg/m   BP Readings from Last 3 Encounters:  06/05/20 112/72  04/24/20 122/80  07/26/19 134/62    Wt Readings from Last 3 Encounters:  06/05/20 172 lb 6.4 oz (78.2 kg)  04/24/20 174 lb 9.6 oz (79.2 kg)  12/06/19 165 lb (74.8 kg)    General appearance: alert, cooperative and appears stated age Ears: normal TM's and external ear canals both ears Throat: lips, mucosa, and tongue normal; teeth and gums normal Neck: no adenopathy, no carotid bruit, supple, symmetrical, trachea midline and thyroid not enlarged, symmetric, no tenderness/mass/nodules Back: symmetric, no curvature. ROM normal. No CVA tenderness. Lungs:  clear to auscultation bilaterally Heart: regular rate and rhythm, S1, S2 normal, no murmur, click, rub or gallop Abdomen: soft, non-tender; bowel sounds normal; no masses,  no organomegaly Pulses: 2+ and symmetric Skin: Skin color, texture, turgor normal. No rashes or lesions Lymph nodes: Cervical, supraclavicular, and axillary nodes normal.  Lab Results  Component Value Date   HGBA1C 5.2 01/31/2019   HGBA1C 5.1 11/24/2018   HGBA1C 5.5 09/30/2017    Lab Results  Component Value Date    CREATININE 0.77 12/10/2019   CREATININE 0.65 01/31/2019   CREATININE 0.75 11/24/2018    Lab Results  Component Value Date   WBC 8.2 12/10/2019   HGB 12.5 12/10/2019   HCT 38.0 12/10/2019   PLT 271.0 12/10/2019   GLUCOSE 79 12/10/2019   CHOL 224 (H) 12/10/2019   TRIG 64.0 12/10/2019   HDL 54.90 12/10/2019   LDLCALC 156 (H) 12/10/2019   ALT 17 12/10/2019   AST 19 12/10/2019   NA 137 12/10/2019   K 3.7 12/10/2019   CL 104 12/10/2019   CREATININE 0.77 12/10/2019   BUN 12 12/10/2019   CO2 25 12/10/2019   TSH 1.76 12/10/2019   HGBA1C 5.2 01/31/2019      Assessment & Plan:   Problem List Items Addressed This Visit      Unprioritized   Major depressive disorder with current active episode    Symptoms of depression are managed with wellbutirn,  And anxiety managed with buspirone.  Recent stressors/triggers discussed.  Encouraged to start counselling with [facility] .      Relevant Medications   busPIRone (BUSPAR) 10 MG tablet   Anxiety associated with depression    Discussed increasing the dose of buspirone to manage current symptoms. Continue trazodone for insomnia.       Relevant Medications   busPIRone (BUSPAR) 10 MG tablet     I provided  30 minutes of  face-to-face time during this encounter reviewing patient's current problems and past surgeries, labs and imaging studies, providing counseling on the above mentioned problems , and coordination  of care .  I have discontinued Benjamine Mola B. Van Pratt's busPIRone and nitrofurantoin (macrocrystal-monohydrate). I have also changed her busPIRone. Additionally, I am having her maintain her estradiol, loratadine, cetirizine, aspirin-acetaminophen-caffeine, ibuprofen, cholecalciferol, traZODone, and buPROPion.  Meds ordered this encounter  Medications  . DISCONTD: busPIRone (BUSPAR) 5 MG tablet    Sig: Take 2 tablets (10 mg total) by mouth 2 (two) times daily.    Dispense:  180 tablet    Refill:  2  .  busPIRone (BUSPAR) 10 MG tablet    Sig: Take 1 tablet (10 mg total) by mouth 2 (two) times daily.    Dispense:  180 tablet    Refill:  1    CORRECTED  RX.  10 MG TABLETS PLEASE FOR DOSE INCREASE    Medications Discontinued During This Encounter  Medication Reason  . nitrofurantoin, macrocrystal-monohydrate, (MACROBID) 100 MG capsule Allergic reaction  . busPIRone (BUSPAR) 5 MG tablet   . busPIRone (BUSPAR) 5 MG tablet     Follow-up: Return in about 3 months (around 09/05/2020).   Crecencio Mc, MD

## 2020-06-07 NOTE — Assessment & Plan Note (Addendum)
Discussed increasing the dose of buspirone to manage current symptoms. Continue trazodone for insomnia.

## 2020-06-07 NOTE — Assessment & Plan Note (Signed)
Symptoms of depression are managed with wellbutirn,  And anxiety managed with buspirone.  Recent stressors/triggers discussed.  Encouraged to start counselling with [facility] .

## 2020-06-30 MED FILL — buPROPion HCL ER (XL) 300 M: 300 | 60 days supply | Qty: 60 | Fill #1

## 2020-07-09 MED FILL — ESTRADIOL 0.1 MG/DAY PATCH: 0.1 | 88 days supply | Qty: 4 | Fill #2

## 2020-08-25 ENCOUNTER — Other Ambulatory Visit: Payer: Self-pay | Admitting: Internal Medicine

## 2020-08-25 MED FILL — buPROPion HCL ER (XL) 300 M: 300 | 60 days supply | Qty: 60 | Fill #0

## 2020-09-11 ENCOUNTER — Other Ambulatory Visit: Payer: Self-pay

## 2020-09-11 ENCOUNTER — Other Ambulatory Visit: Payer: Self-pay | Admitting: Internal Medicine

## 2020-09-11 MED ORDER — BUPROPION HCL ER (XL) 300 MG PO TB24: 300.0000 mg | ORAL_TABLET | Freq: Every day | ORAL | 1 refills | Status: DC

## 2020-10-02 MED FILL — ESTRADIOL 0.1 MG/DAY PATCH: 0.1 | 88 days supply | Qty: 4 | Fill #3

## 2020-10-08 MED FILL — busPIRone HCL 10 MG TABS: 10 | 90 days supply | Qty: 180 | Fill #1

## 2020-10-23 DIAGNOSIS — H5213 Myopia, bilateral: Secondary | ICD-10-CM | POA: Diagnosis not present

## 2020-11-06 MED FILL — buPROPion HCL ER (XL) 300 M: 300 | 90 days supply | Qty: 90 | Fill #0

## 2021-02-02 ENCOUNTER — Other Ambulatory Visit: Payer: Self-pay | Admitting: Internal Medicine

## 2021-02-02 MED FILL — busPIRone HCL 10 MG TABS: 10 | 90 days supply | Qty: 180 | Fill #0

## 2021-02-02 MED FILL — buPROPion HCL ER (XL) 300 M: 300 | 90 days supply | Qty: 90 | Fill #1

## 2021-02-09 DIAGNOSIS — N951 Menopausal and female climacteric states: Secondary | ICD-10-CM | POA: Diagnosis not present

## 2021-02-09 DIAGNOSIS — B373 Candidiasis of vulva and vagina: Secondary | ICD-10-CM | POA: Diagnosis not present

## 2021-02-09 DIAGNOSIS — Z01419 Encounter for gynecological examination (general) (routine) without abnormal findings: Secondary | ICD-10-CM | POA: Diagnosis not present

## 2021-02-09 DIAGNOSIS — G43829 Menstrual migraine, not intractable, without status migrainosus: Secondary | ICD-10-CM | POA: Diagnosis not present

## 2021-02-17 ENCOUNTER — Other Ambulatory Visit (HOSPITAL_COMMUNITY): Payer: Self-pay | Admitting: Obstetrics and Gynecology

## 2021-02-24 ENCOUNTER — Other Ambulatory Visit (HOSPITAL_BASED_OUTPATIENT_CLINIC_OR_DEPARTMENT_OTHER): Payer: Self-pay

## 2021-04-23 ENCOUNTER — Encounter: Payer: Self-pay | Admitting: Internal Medicine

## 2021-04-24 ENCOUNTER — Encounter: Payer: 59 | Admitting: Internal Medicine

## 2021-04-28 LAB — LIPID PANEL
Cholesterol: 230 — AB (ref 0–200)
HDL: 69 (ref 35–70)
LDL Cholesterol: 144
LDl/HDL Ratio: 2.1
Triglycerides: 84 (ref 40–160)

## 2021-04-28 LAB — COMPREHENSIVE METABOLIC PANEL
Albumin: 4.3 (ref 3.5–5.0)
GFR calc non Af Amer: 102
Globulin: 2.6

## 2021-04-28 LAB — BASIC METABOLIC PANEL
BUN: 15 (ref 4–21)
Creatinine: 0.8 (ref 0.5–1.1)
Glucose: 81

## 2021-04-28 LAB — HEMOGLOBIN A1C: Hemoglobin A1C: 5.3

## 2021-04-28 LAB — HEPATIC FUNCTION PANEL
ALT: 10 (ref 7–35)
AST: 18 (ref 13–35)
Alkaline Phosphatase: 52 (ref 25–125)
Bilirubin, Total: 0.2

## 2021-04-30 ENCOUNTER — Other Ambulatory Visit: Payer: Self-pay | Admitting: Internal Medicine

## 2021-04-30 ENCOUNTER — Other Ambulatory Visit (HOSPITAL_COMMUNITY): Payer: Self-pay

## 2021-04-30 MED ORDER — BUPROPION HCL ER (XL) 300 MG PO TB24
ORAL_TABLET | Freq: Every day | ORAL | 1 refills | Status: DC
  Filled 2021-04-30: qty 90, 90d supply, fill #0
  Filled 2021-08-11: qty 90, 90d supply, fill #1

## 2021-05-01 ENCOUNTER — Encounter: Payer: 59 | Admitting: Internal Medicine

## 2021-05-05 ENCOUNTER — Other Ambulatory Visit (HOSPITAL_COMMUNITY): Payer: Self-pay

## 2021-05-05 ENCOUNTER — Encounter: Payer: Self-pay | Admitting: Internal Medicine

## 2021-05-05 ENCOUNTER — Ambulatory Visit (INDEPENDENT_AMBULATORY_CARE_PROVIDER_SITE_OTHER): Payer: 59 | Admitting: Internal Medicine

## 2021-05-05 ENCOUNTER — Other Ambulatory Visit: Payer: Self-pay

## 2021-05-05 VITALS — BP 120/72 | HR 74 | Temp 96.7°F | Ht 62.0 in | Wt 161.0 lb

## 2021-05-05 DIAGNOSIS — Z0000 Encounter for general adult medical examination without abnormal findings: Secondary | ICD-10-CM

## 2021-05-05 DIAGNOSIS — Z1231 Encounter for screening mammogram for malignant neoplasm of breast: Secondary | ICD-10-CM

## 2021-05-05 DIAGNOSIS — F330 Major depressive disorder, recurrent, mild: Secondary | ICD-10-CM

## 2021-05-05 DIAGNOSIS — Z1211 Encounter for screening for malignant neoplasm of colon: Secondary | ICD-10-CM

## 2021-05-05 MED ORDER — TRAZODONE HCL 50 MG PO TABS
25.0000 mg | ORAL_TABLET | Freq: Every evening | ORAL | 3 refills | Status: DC | PRN
  Filled 2021-05-05: qty 90, 90d supply, fill #0

## 2021-05-05 NOTE — Assessment & Plan Note (Signed)

## 2021-05-05 NOTE — Patient Instructions (Signed)
Good to see you!   congrats on the success with [facility]!  Your cholesterol is fine, nothing unusual on the labs  WE can check B12  Thyroid  , CBC,  And/or Vitamin D ( if you want to ) down the road BUT THEY ARE NOT NECESSARY    3 D MAMMOGRAM ORDERED FOR [facility]  AND COLOGUARD HAS BEEN ORDERED   TRAZODONE HAS BEEN REFILLED

## 2021-05-05 NOTE — Assessment & Plan Note (Signed)
Symptoms of depression are managed with wellbutirn,  And anxiety managed with buspirone.  Recent stressors/triggers discussed.

## 2021-05-05 NOTE — Progress Notes (Signed)
Patient ID: Heather Pratt, female    DOB: 10/29/1971  Age: 50 y.o. MRN: [id number]  The patient is here for annual preventive  examination and management of other chronic and acute problems.   The risk factors are reflected in the social history.  Sees Dr Garwin Brothers for annual GYN   The roster of all physicians providing medical care to patient - is listed in the Snapshot section of the chart.  Activities of daily living:  The patient is 100% independent in all ADLs: dressing, toileting, feeding as well as independent mobility  Home safety : The patient has smoke detectors in the home. They wear seatbelts.  There are no firearms at home. There is no violence in the home.   There is no risks for hepatitis, STDs or HIV. There is no   history of blood transfusion. They have no travel history to infectious disease endemic areas of the world.  The patient has seen their dentist in the last six month. They have seen their eye doctor in the last year. They admit to slight hearing difficulty with regard to whispered voices and some television programs.  They have deferred audiologic testing in the last year.  They do not  have excessive sun exposure. Discussed the need for sun protection: hats, long sleeves and use of sunscreen if there is significant sun exposure.   Diet: the importance of a healthy diet is discussed. They do have a healthy diet.  The benefits of regular aerobic exercise were discussed.  She is using [facility] since October and is happy with the lifestyle  And has lost ten lbs.  Doing weight training 3 mornings per week , pluss yoga,  Tennis walking the dog.    Depression screen: there are no signs or vegative symptoms of  Untreated depression- irritability, change in appetite, anhedonia, sadness/tearfullness.   The following portions of the patient's history were reviewed and updated as appropriate: allergies, current medications, past family history, past medical history,  past  surgical history, past social history  and problem list.  Visual acuity was not assessed per patient preference since she has regular follow up with her ophthalmologist. Hearing and body mass index were assessed and reviewed.   During the course of the visit the patient was educated and counseled about appropriate screening and preventive services including : fall prevention , diabetes screening, nutrition counseling, colorectal cancer screening, and recommended immunizations.    CC: The primary encounter diagnosis was Encounter for screening mammogram for malignant neoplasm of breast. Diagnoses of Colon cancer screening, Encounter for preventive health examination, and Mild episode of recurrent major depressive disorder ([place]) were also pertinent to this visit.  History Heather Pratt has a past medical history of Allergy, Anxiety, Depression, HLD (hyperlipidemia), Joint pain, and Migraines.   She has a past surgical history that includes Cesarean section with bilateral tubal ligation (005) and Endometrial ablation (2011).   Her family history includes Alcohol abuse in her father; Depression in her father; Eating disorder in her father; Heart disease in her maternal grandfather and maternal grandmother; Hyperlipidemia in her father and mother; Hypertension in her father, maternal grandfather, maternal grandmother, and mother; Obesity in her father; Prostate cancer in her maternal grandfather; Thyroid disease in her mother.She reports that she has never smoked. She has never used smokeless tobacco. She reports current alcohol use of about 2.0 standard drinks of alcohol per week. She reports that she does not use drugs.  Outpatient Medications Prior to Visit  Medication  Sig Dispense Refill  . aspirin-acetaminophen-caffeine (EXCEDRIN MIGRAINE) 250-250-65 MG tablet Take 1 tablet by mouth every 6 (six) hours as needed for headache.    Marland Kitchen buPROPion (WELLBUTRIN XL) 300 MG 24 hr tablet TAKE 1 TABLET BY MOUTH  ONCE DAILY 90 tablet 1  . busPIRone (BUSPAR) 10 MG tablet TAKE 1 TABLET BY MOUTH 2 TIMES DAILY 180 tablet 1  . cetirizine (ZYRTEC) 10 MG tablet Take 10 mg by mouth daily as needed for allergies.    . cholecalciferol (VITAMIN D3) 25 MCG (1000 UT) tablet Take 1,000 Units by mouth daily.    Marland Kitchen estradiol (CLIMARA - DOSED IN MG/24 HR) 0.1 mg/24hr patch Place 0.1 mg onto the skin once a week.    Marland Kitchen ibuprofen (ADVIL,MOTRIN) 200 MG tablet Take 200 mg by mouth every 6 (six) hours as needed.    . loratadine (CLARITIN) 10 MG tablet Take 10 mg by mouth daily as needed for allergies.    Marland Kitchen nystatin-triamcinolone (MYCOLOG II) cream APPLY TO THE AFFECTED AREA(S) 2 TIMES DAILY AS DIRECTED 15 g 3  . fluconazole (DIFLUCAN) 150 MG tablet TAKE 1 TABLET BY MOUTH ON DAYS 1,3, AND 7 AS DIRECTED (Patient not taking: Reported on 05/05/2021) 1 tablet 3  . traZODone (DESYREL) 50 MG tablet Take 0.5-1 tablets (25-50 mg total) by mouth at bedtime as needed for sleep. 90 tablet 3   No facility-administered medications prior to visit.    Review of Systems   Patient denies headache, fevers, malaise, unintentional weight loss, skin rash, eye pain, sinus congestion and sinus pain, sore throat, dysphagia,  hemoptysis , cough, dyspnea, wheezing, chest pain, palpitations, orthopnea, edema, abdominal pain, nausea, melena, diarrhea, constipation, flank pain, dysuria, hematuria, urinary  Frequency, nocturia, numbness, tingling, seizures,  Focal weakness, Loss of consciousness,  Tremor, insomnia, depression, anxiety, and suicidal ideation.      Objective:  BP 120/72 (BP Location: Left Arm, Patient Position: Sitting, Cuff Size: Normal)   Pulse 74   Temp (!) 96.7 F (35.9 C) (Temporal)   Ht 5' 2"  (1.575 m)   Wt 161 lb (73 kg)   SpO2 99%   BMI 29.45 kg/m   Physical Exam  General appearance: alert, cooperative and appears stated age Ears: normal TM's and external ear canals both ears Throat: lips, mucosa, and tongue normal; teeth  and gums normal Neck: no adenopathy, no carotid bruit, supple, symmetrical, trachea midline and thyroid not enlarged, symmetric, no tenderness/mass/nodules Back: symmetric, no curvature. ROM normal. No CVA tenderness. Lungs: clear to auscultation bilaterally Heart: regular rate and rhythm, S1, S2 normal, no murmur, click, rub or gallop Abdomen: soft, non-tender; bowel sounds normal; no masses,  no organomegaly Pulses: 2+ and symmetric Skin: Skin color, texture, turgor normal. No rashes or lesions Lymph nodes: Cervical, supraclavicular, and axillary nodes normal.  Assessment & Plan:   Problem List Items Addressed This Visit      Unprioritized   Encounter for preventive health examination    age appropriate education and counseling updated, referrals for preventative services and immunizations addressed, dietary and smoking counseling addressed, most recent labs reviewed.  I have personally reviewed and have noted:  1) the patient's medical and social history 2) The pt's use of alcohol, tobacco, and illicit drugs 3) The patient's current medications and supplements 4) Functional ability including ADL's, fall risk, home safety risk, hearing and visual impairment 5) Diet and physical activities 6) Evidence for depression or mood disorder 7) The patient's height, weight, and BMI have been recorded in the  chart  I have made referrals, and provided counseling and education based on review of the above      Major depressive disorder with current active episode    Symptoms of depression are managed with wellbutirn,  And anxiety managed with buspirone.  Recent stressors/triggers discussed.        Relevant Medications   traZODone (DESYREL) 50 MG tablet    Other Visit Diagnoses    Encounter for screening mammogram for malignant neoplasm of breast    -  Primary   Relevant Orders   MM DIGITAL SCREENING BILATERAL   Colon cancer screening       Relevant Orders   Cologuard      I have  discontinued Benjamine Mola B. Van Fleet's fluconazole. I have also changed her traZODone. Additionally, I am having her maintain her estradiol, loratadine, cetirizine, aspirin-acetaminophen-caffeine, ibuprofen, cholecalciferol, nystatin-triamcinolone, busPIRone, and buPROPion.  Meds ordered this encounter  Medications  . traZODone (DESYREL) 50 MG tablet    Sig: Take 1/2 to 1 tablet (25 - 50 mg total) by mouth at bedtime as needed for sleep.    Dispense:  90 tablet    Refill:  3    Medications Discontinued During This Encounter  Medication Reason  . fluconazole (DIFLUCAN) 150 MG tablet Completed Course  . traZODone (DESYREL) 50 MG tablet Reorder    Follow-up: No follow-ups on file.   Crecencio Mc, MD

## 2021-05-06 ENCOUNTER — Other Ambulatory Visit (HOSPITAL_COMMUNITY): Payer: Self-pay

## 2021-07-03 ENCOUNTER — Other Ambulatory Visit (HOSPITAL_COMMUNITY): Payer: Self-pay

## 2021-07-03 MED FILL — Buspirone HCl Tab 10 MG: ORAL | 90 days supply | Qty: 180 | Fill #0 | Status: AC

## 2021-07-06 ENCOUNTER — Other Ambulatory Visit: Payer: Self-pay | Admitting: Internal Medicine

## 2021-07-06 ENCOUNTER — Ambulatory Visit
Admission: RE | Admit: 2021-07-06 | Discharge: 2021-07-06 | Disposition: A | Discharge status: Home / Self Care | Payer: 59 | Source: Ambulatory Visit | Attending: Internal Medicine | Admitting: Internal Medicine

## 2021-07-06 ENCOUNTER — Other Ambulatory Visit: Payer: Self-pay

## 2021-07-06 DIAGNOSIS — Z1231 Encounter for screening mammogram for malignant neoplasm of breast: Secondary | ICD-10-CM

## 2021-07-07 ENCOUNTER — Telehealth: Payer: Self-pay

## 2021-07-07 NOTE — Telephone Encounter (Signed)
LMTCB. Need to find out if pt has received her cologuard kit and if so has she completed it yet.

## 2021-07-08 NOTE — Telephone Encounter (Signed)
Patient is returning your call,patient stated it is ok to leave her a voicemail if she does not answer when you call her.

## 2021-07-09 NOTE — Telephone Encounter (Signed)
I called patient & LM asking if she had received as well as completed Cologuard kit. I asked that she call back or mychart [place] to let us know.

## 2021-07-10 DIAGNOSIS — Z1211 Encounter for screening for malignant neoplasm of colon: Secondary | ICD-10-CM | POA: Diagnosis not present

## 2021-07-10 LAB — COLOGUARD: Cologuard: NEGATIVE

## 2021-07-16 LAB — COLOGUARD
COLOGUARD: NEGATIVE
Cologuard: NEGATIVE

## 2021-08-11 ENCOUNTER — Other Ambulatory Visit (HOSPITAL_COMMUNITY): Payer: Self-pay

## 2021-09-24 ENCOUNTER — Other Ambulatory Visit (HOSPITAL_COMMUNITY): Payer: Self-pay

## 2021-09-24 ENCOUNTER — Other Ambulatory Visit: Payer: Self-pay

## 2021-09-24 MED ORDER — ESTRADIOL 0.1 MG/24HR TD PTWK
0.1000 mg | MEDICATED_PATCH | TRANSDERMAL | 1 refills | Status: DC
  Filled 2021-09-24: qty 4, 28d supply, fill #0
  Filled 2021-12-02: qty 4, 28d supply, fill #1

## 2021-09-25 ENCOUNTER — Other Ambulatory Visit (HOSPITAL_COMMUNITY): Payer: Self-pay

## 2021-11-13 ENCOUNTER — Other Ambulatory Visit (HOSPITAL_COMMUNITY): Payer: Self-pay

## 2021-11-13 ENCOUNTER — Other Ambulatory Visit: Payer: Self-pay | Admitting: Internal Medicine

## 2021-11-13 MED ORDER — BUPROPION HCL ER (XL) 300 MG PO TB24
ORAL_TABLET | Freq: Every day | ORAL | 1 refills | Status: DC
  Filled 2021-11-13: qty 90, 90d supply, fill #0
  Filled 2022-02-22: qty 90, 90d supply, fill #1

## 2021-11-16 ENCOUNTER — Ambulatory Visit (INDEPENDENT_AMBULATORY_CARE_PROVIDER_SITE_OTHER): Payer: 59 | Admitting: Dermatology

## 2021-11-16 ENCOUNTER — Other Ambulatory Visit: Payer: Self-pay

## 2021-11-16 DIAGNOSIS — L814 Other melanin hyperpigmentation: Secondary | ICD-10-CM | POA: Diagnosis not present

## 2021-11-16 DIAGNOSIS — L578 Other skin changes due to chronic exposure to nonionizing radiation: Secondary | ICD-10-CM

## 2021-11-16 DIAGNOSIS — D239 Other benign neoplasm of skin, unspecified: Secondary | ICD-10-CM

## 2021-11-16 DIAGNOSIS — D235 Other benign neoplasm of skin of trunk: Secondary | ICD-10-CM | POA: Diagnosis not present

## 2021-11-16 DIAGNOSIS — L821 Other seborrheic keratosis: Secondary | ICD-10-CM

## 2021-11-16 DIAGNOSIS — D229 Melanocytic nevi, unspecified: Secondary | ICD-10-CM

## 2021-11-16 DIAGNOSIS — Z1283 Encounter for screening for malignant neoplasm of skin: Secondary | ICD-10-CM

## 2021-11-16 DIAGNOSIS — D1800 Hemangioma unspecified site: Secondary | ICD-10-CM

## 2021-11-16 NOTE — Patient Instructions (Signed)

## 2021-11-16 NOTE — Progress Notes (Signed)
   New Patient Visit  Subjective  Heather Pratt is a 50 y.o. female who presents for the following: Annual Exam (Mole check ).  The patient presents for Total-Body Skin Exam (TBSE) for skin cancer screening and mole check.  The patient has spots, moles and lesions to be evaluated, some may be new or changing and the patient has concerns that these could be cancer.   The following portions of the chart were reviewed this encounter and updated as appropriate:   Tobacco  Allergies  Meds  Problems  Med Hx  Surg Hx  Fam Hx     Review of Systems:  No other skin or systemic complaints except as noted in HPI or Assessment and Plan.  Objective  Well appearing patient in no apparent distress; mood and affect are within normal limits.  A full examination was performed including scalp, head, eyes, ears, nose, lips, neck, chest, axillae, abdomen, back, buttocks, bilateral upper extremities, bilateral lower extremities, hands, feet, fingers, toes, fingernails, and toenails. All findings within normal limits unless otherwise noted below.  Right Upper Back Firm pink/brown papulenodule with dimple sign.    Assessment & Plan  Dermatofibroma Right Upper Back  Benign-appearing.  Observation.  Call clinic for new or changing moles.  Recommend daily use of broad spectrum spf 30+ sunscreen to sun-exposed areas.    Skin cancer screening   Lentigines - Scattered tan macules - Due to sun exposure - Benign-appearing, observe - Recommend daily broad spectrum sunscreen SPF 30+ to sun-exposed areas, reapply every 2 hours as needed. - Call for any changes  Seborrheic Keratoses - Stuck-on, waxy, tan-brown papules and/or plaques  - Benign-appearing - Discussed benign etiology and prognosis. - Observe - Call for any changes  Melanocytic Nevi - Tan-brown and/or pink-flesh-colored symmetric macules and papules - Benign appearing on exam today - Observation - Call clinic for new or changing  moles - Recommend daily use of broad spectrum spf 30+ sunscreen to sun-exposed areas.   Hemangiomas - Red papules - Discussed benign nature - Observe - Call for any changes  Actinic Damage - Chronic condition, secondary to cumulative UV/sun exposure - diffuse scaly erythematous macules with underlying dyspigmentation - Recommend daily broad spectrum sunscreen SPF 30+ to sun-exposed areas, reapply every 2 hours as needed.  - Staying in the shade or wearing long sleeves, sun glasses (UVA+UVB protection) and wide brim hats (4-inch brim around the entire circumference of the hat) are also recommended for sun protection.  - Call for new or changing lesions.  Skin cancer screening performed today.   Return in about 1 year (around 11/16/2022) for TBSE .  IMarye Round, CMA, am acting as scribe for Sarina Ser, MD .  Documentation: I have reviewed the above documentation for accuracy and completeness, and I agree with the above.  Sarina Ser, MD

## 2021-11-18 ENCOUNTER — Other Ambulatory Visit (HOSPITAL_COMMUNITY): Payer: Self-pay

## 2021-11-21 ENCOUNTER — Encounter: Payer: Self-pay | Admitting: Dermatology

## 2021-12-02 ENCOUNTER — Other Ambulatory Visit (HOSPITAL_COMMUNITY): Payer: Self-pay

## 2021-12-03 ENCOUNTER — Other Ambulatory Visit (HOSPITAL_COMMUNITY): Payer: Self-pay

## 2022-01-13 DIAGNOSIS — N951 Menopausal and female climacteric states: Secondary | ICD-10-CM | POA: Diagnosis not present

## 2022-01-13 DIAGNOSIS — R5383 Other fatigue: Secondary | ICD-10-CM | POA: Diagnosis not present

## 2022-01-19 DIAGNOSIS — F419 Anxiety disorder, unspecified: Secondary | ICD-10-CM | POA: Diagnosis not present

## 2022-01-19 DIAGNOSIS — G479 Sleep disorder, unspecified: Secondary | ICD-10-CM | POA: Diagnosis not present

## 2022-01-19 DIAGNOSIS — Z6831 Body mass index (BMI) 31.0-31.9, adult: Secondary | ICD-10-CM | POA: Diagnosis not present

## 2022-01-19 DIAGNOSIS — F329 Major depressive disorder, single episode, unspecified: Secondary | ICD-10-CM | POA: Diagnosis not present

## 2022-01-19 DIAGNOSIS — N951 Menopausal and female climacteric states: Secondary | ICD-10-CM | POA: Diagnosis not present

## 2022-01-19 DIAGNOSIS — R6882 Decreased libido: Secondary | ICD-10-CM | POA: Diagnosis not present

## 2022-01-19 DIAGNOSIS — R232 Flushing: Secondary | ICD-10-CM | POA: Diagnosis not present

## 2022-01-19 DIAGNOSIS — R5383 Other fatigue: Secondary | ICD-10-CM | POA: Diagnosis not present

## 2022-01-19 DIAGNOSIS — G43909 Migraine, unspecified, not intractable, without status migrainosus: Secondary | ICD-10-CM | POA: Diagnosis not present

## 2022-01-27 DIAGNOSIS — N951 Menopausal and female climacteric states: Secondary | ICD-10-CM | POA: Diagnosis not present

## 2022-01-27 DIAGNOSIS — G479 Sleep disorder, unspecified: Secondary | ICD-10-CM | POA: Diagnosis not present

## 2022-01-27 DIAGNOSIS — Z6830 Body mass index (BMI) 30.0-30.9, adult: Secondary | ICD-10-CM | POA: Diagnosis not present

## 2022-01-27 DIAGNOSIS — R232 Flushing: Secondary | ICD-10-CM | POA: Diagnosis not present

## 2022-02-10 DIAGNOSIS — N951 Menopausal and female climacteric states: Secondary | ICD-10-CM | POA: Diagnosis not present

## 2022-02-10 DIAGNOSIS — B3731 Acute candidiasis of vulva and vagina: Secondary | ICD-10-CM | POA: Diagnosis not present

## 2022-02-10 DIAGNOSIS — Z01419 Encounter for gynecological examination (general) (routine) without abnormal findings: Secondary | ICD-10-CM | POA: Diagnosis not present

## 2022-02-10 LAB — HM PAP SMEAR: HM Pap smear: NEGATIVE

## 2022-02-22 ENCOUNTER — Other Ambulatory Visit (HOSPITAL_COMMUNITY): Payer: Self-pay

## 2022-02-24 DIAGNOSIS — N951 Menopausal and female climacteric states: Secondary | ICD-10-CM | POA: Diagnosis not present

## 2022-02-24 DIAGNOSIS — R5383 Other fatigue: Secondary | ICD-10-CM | POA: Diagnosis not present

## 2022-03-01 DIAGNOSIS — G479 Sleep disorder, unspecified: Secondary | ICD-10-CM | POA: Diagnosis not present

## 2022-03-01 DIAGNOSIS — R232 Flushing: Secondary | ICD-10-CM | POA: Diagnosis not present

## 2022-03-01 DIAGNOSIS — N951 Menopausal and female climacteric states: Secondary | ICD-10-CM | POA: Diagnosis not present

## 2022-03-01 DIAGNOSIS — Z6830 Body mass index (BMI) 30.0-30.9, adult: Secondary | ICD-10-CM | POA: Diagnosis not present

## 2022-05-06 ENCOUNTER — Ambulatory Visit (INDEPENDENT_AMBULATORY_CARE_PROVIDER_SITE_OTHER): Payer: 59 | Admitting: Internal Medicine

## 2022-05-06 ENCOUNTER — Encounter: Payer: Self-pay | Admitting: Internal Medicine

## 2022-05-06 VITALS — BP 118/78 | HR 77 | Temp 97.4°F | Ht 62.0 in | Wt 162.6 lb

## 2022-05-06 DIAGNOSIS — Z0000 Encounter for general adult medical examination without abnormal findings: Secondary | ICD-10-CM

## 2022-05-06 DIAGNOSIS — R7301 Impaired fasting glucose: Secondary | ICD-10-CM

## 2022-05-06 DIAGNOSIS — R5383 Other fatigue: Secondary | ICD-10-CM

## 2022-05-06 DIAGNOSIS — F330 Major depressive disorder, recurrent, mild: Secondary | ICD-10-CM | POA: Diagnosis not present

## 2022-05-06 DIAGNOSIS — Z1159 Encounter for screening for other viral diseases: Secondary | ICD-10-CM | POA: Diagnosis not present

## 2022-05-06 DIAGNOSIS — Z1231 Encounter for screening mammogram for malignant neoplasm of breast: Secondary | ICD-10-CM

## 2022-05-06 DIAGNOSIS — E785 Hyperlipidemia, unspecified: Secondary | ICD-10-CM

## 2022-05-06 LAB — COMPREHENSIVE METABOLIC PANEL
ALT: 11 U/L (ref 0–35)
AST: 17 U/L (ref 0–37)
Albumin: 4.1 g/dL (ref 3.5–5.2)
Alkaline Phosphatase: 46 U/L (ref 39–117)
BUN: 18 mg/dL (ref 6–23)
CO2: 25 mEq/L (ref 19–32)
Calcium: 9.2 mg/dL (ref 8.4–10.5)
Chloride: 106 mEq/L (ref 96–112)
Creatinine, Ser: 0.83 mg/dL (ref 0.40–1.20)
GFR: 82.38 mL/min (ref >60.00)
Glucose, Bld: 84 mg/dL (ref 70–99)
Potassium: 4.3 mEq/L (ref 3.5–5.1)
Sodium: 139 mEq/L (ref 135–145)
Total Bilirubin: 0.5 mg/dL (ref 0.2–1.2)
Total Protein: 6.8 g/dL (ref 6.0–8.3)

## 2022-05-06 LAB — CBC WITH DIFFERENTIAL/PLATELET
Basophils Absolute: 0.1 10*3/uL (ref 0.0–0.1)
Basophils Relative: 0.9 % (ref 0.0–3.0)
Eosinophils Absolute: 0.4 10*3/uL (ref 0.0–0.7)
Eosinophils Relative: 6.6 % — ABNORMAL HIGH (ref 0.0–5.0)
HCT: 40.8 % (ref 36.0–46.0)
Hemoglobin: 13.5 g/dL (ref 12.0–15.0)
Lymphocytes Relative: 31.1 % (ref 12.0–46.0)
Lymphs Abs: 1.7 10*3/uL (ref 0.7–4.0)
MCHC: 33.1 g/dL (ref 30.0–36.0)
MCV: 89.1 fl (ref 78.0–100.0)
Monocytes Absolute: 0.5 10*3/uL (ref 0.1–1.0)
Monocytes Relative: 9.1 % (ref 3.0–12.0)
Neutro Abs: 2.9 10*3/uL (ref 1.4–7.7)
Neutrophils Relative %: 52.3 % (ref 43.0–77.0)
Platelets: 238 10*3/uL (ref 150.0–400.0)
RBC: 4.57 Mil/uL (ref 3.87–5.11)
RDW: 13.4 % (ref 11.5–15.5)
WBC: 5.5 10*3/uL (ref 4.0–10.5)

## 2022-05-06 LAB — LIPID PANEL
Cholesterol: 229 mg/dL — ABNORMAL HIGH (ref 0–200)
HDL: 59.9 mg/dL (ref >39.00)
LDL Cholesterol: 159 mg/dL — ABNORMAL HIGH (ref 0–99)
NonHDL: 169.24
Total CHOL/HDL Ratio: 4
Triglycerides: 52 mg/dL (ref 0.0–149.0)
VLDL: 10.4 mg/dL (ref 0.0–40.0)

## 2022-05-06 LAB — TSH: TSH: 1.21 u[IU]/mL (ref 0.35–5.50)

## 2022-05-06 MED ORDER — ZOSTER VAC RECOMB ADJUVANTED 50 MCG/0.5ML IM SUSR: 0.5000 mL | Freq: Once | INTRAMUSCULAR | 1 refills | Status: AC

## 2022-05-06 NOTE — Progress Notes (Unsigned)
Patient ID: Heather Pratt, female    DOB: 25-Sep-1971  Age: 51 y.o. MRN: [id number]  The patient is here for annual preventive non gyn examination and management of other chronic and acute problems.   The risk factors are reflected in the social history.  The roster of all physicians providing medical care to patient - is listed in the Snapshot section of the chart.  Activities of daily living:  The patient is 100% independent in all ADLs: dressing, toileting, feeding as well as independent mobility  Home safety : The patient has smoke detectors in the home. They wear seatbelts.  There are no firearms at home. There is no violence in the home.   There is no risks for hepatitis, STDs or HIV. There is no   history of blood transfusion. They have no travel history to infectious disease endemic areas of the world.  The patient has seen their dentist in the last six month. They have seen their eye doctor in the last year. They admit to slight hearing difficulty with regard to whispered voices and some television programs.  They have deferred audiologic testing in the last year.  They do not  have excessive sun exposure. Discussed the need for sun protection: hats, long sleeves and use of sunscreen if there is significant sun exposure.   Diet: the importance of a healthy diet is discussed. They do have a healthy diet.  The benefits of regular aerobic exercise were discussed. Heather Pratt exercises  5 day weekly .   Sees eye doctor annually for contact lenses .  Had a derm exam in Feb. Follow up 3 yrs  Depression screen: there are no signs or vegative symptoms of depression- irritability, change in appetite, anhedonia, sadness/tearfullness.  Cognitive assessment: the patient manages all their financial and personal affairs and is actively engaged. They could relate day,date,year and events; recalled 2/3 objects at 3 minutes; performed clock-face test normally.  The following portions of the patient's  history were reviewed and updated as appropriate: allergies, current medications, past family history, past medical history,  past surgical history, past social history  and problem list.  Visual acuity was not assessed per patient preference since Heather Pratt has regular follow up with her ophthalmologist. Hearing and body mass index were assessed and reviewed.   During the course of the visit the patient was educated and counseled about appropriate screening and preventive services including : fall prevention , diabetes screening, nutrition counseling, colorectal cancer screening, and recommended immunizations.    CC: The primary encounter diagnosis was Encounter for screening mammogram for malignant neoplasm of breast. Diagnoses of Fatigue, unspecified type, Encounter for preventive health examination, Hyperlipidemia, unspecified hyperlipidemia type, and Impaired fasting glucose were also pertinent to this visit.  1) perimenopause:  irregular periods,  stopped estrogen patch and receiving testosterone pellets,  which improved sleep and headaches .  Stopped the periods, last one end of April    History Heather Pratt has a past medical history of Allergy, Anxiety, Depression, HLD (hyperlipidemia), Joint pain, and Migraines.   Heather Pratt has a past surgical history that includes Cesarean section with bilateral tubal ligation (005) and Endometrial ablation (2011).   Her family history includes Alcohol abuse in her father; Depression in her father; Eating disorder in her father; Heart disease in her maternal grandfather and maternal grandmother; Hyperlipidemia in her father and mother; Hypertension in her father, maternal grandfather, maternal grandmother, and mother; Obesity in her father; Prostate cancer in her maternal grandfather; Thyroid disease in  her mother.Heather Pratt reports that Heather Pratt has never smoked. Heather Pratt has never used smokeless tobacco. Heather Pratt reports current alcohol use of about 2.0 standard drinks per week. Heather Pratt reports  that Heather Pratt does not use drugs.  Outpatient Medications Prior to Visit  Medication Sig Dispense Refill   aspirin-acetaminophen-caffeine (EXCEDRIN MIGRAINE) 250-250-65 MG tablet Take 1 tablet by mouth every 6 (six) hours as needed for headache.     buPROPion (WELLBUTRIN XL) 300 MG 24 hr tablet TAKE 1 TABLET BY MOUTH ONCE DAILY 90 tablet 1   cetirizine (ZYRTEC) 10 MG tablet Take 10 mg by mouth daily as needed for allergies.     ibuprofen (ADVIL,MOTRIN) 200 MG tablet Take 200 mg by mouth every 6 (six) hours as needed.     loratadine (CLARITIN) 10 MG tablet Take 10 mg by mouth daily as needed for allergies.     cholecalciferol (VITAMIN D3) 25 MCG (1000 UT) tablet Take 1,000 Units by mouth daily. (Patient not taking: Reported on 05/06/2022)     estradiol (CLIMARA - DOSED IN MG/24 HR) 0.1 mg/24hr patch Place 1 patch onto the skin once a week. (Patient not taking: Reported on 05/06/2022) 4 patch 1   traZODone (DESYREL) 50 MG tablet Take 1/2 to 1 tablet (25 - 50 mg total) by mouth at bedtime as needed for sleep. (Patient not taking: Reported on 05/06/2022) 90 tablet 3   No facility-administered medications prior to visit.    Review of Systems  Patient denies headache, fevers, malaise, unintentional weight loss, skin rash, eye pain, sinus congestion and sinus pain, sore throat, dysphagia,  hemoptysis , cough, dyspnea, wheezing, chest pain, palpitations, orthopnea, edema, abdominal pain, nausea, melena, diarrhea, constipation, flank pain, dysuria, hematuria, urinary  Frequency, nocturia, numbness, tingling, seizures,  Focal weakness, Loss of consciousness,  Tremor, insomnia, depression, anxiety, and suicidal ideation.     Objective:  BP 118/78 (BP Location: Left Arm, Patient Position: Sitting, Cuff Size: Normal)   Pulse 77   Temp (!) 97.4 F (36.3 C) (Oral)   Ht 5' 2"  (1.575 m)   Wt 162 lb 9.6 oz (73.8 kg)   LMP 04/04/2022 (Exact Date)   SpO2 99%   BMI 29.74 kg/m   Physical Exam   General  appearance: alert, cooperative and appears stated age Head: Normocephalic, without obvious abnormality, atraumatic Eyes: conjunctivae/corneas clear. PERRL, EOM's intact. Fundi benign. Ears: normal TM's and external ear canals both ears Nose: Nares normal. Septum midline. Mucosa normal. No drainage or sinus tenderness. Throat: lips, mucosa, and tongue normal; teeth and gums normal Neck: no adenopathy, no carotid bruit, no JVD, supple, symmetrical, trachea midline and thyroid not enlarged, symmetric, no tenderness/mass/nodules Lungs: clear to auscultation bilaterally Breasts: deferred,  done by GYN Heather Pratt.  Heart: regular rate and rhythm, S1, S2 normal, no murmur, click, rub or gallop Abdomen: soft, non-tender; bowel sounds normal; no masses,  no organomegaly Extremities: extremities normal, atraumatic, no cyanosis or edema Pulses: 2+ and symmetric Skin: Skin color, texture, turgor normal. No rashes or lesions Neurologic: Alert and oriented X 3, normal strength and tone. Normal symmetric reflexes. Normal coordination and gait.    Assessment & Plan:   Problem List Items Addressed This Visit     Encounter for preventive health examination   Fatigue   Other Visit Diagnoses     Encounter for screening mammogram for malignant neoplasm of breast    -  Primary   Hyperlipidemia, unspecified hyperlipidemia type       Impaired fasting glucose  I am having Heather Pratt maintain her loratadine, cetirizine, aspirin-acetaminophen-caffeine, ibuprofen, cholecalciferol, traZODone, estradiol, and buPROPion.  No orders of the defined types were placed in this encounter.   There are no discontinued medications.  Follow-up: No follow-ups on file.   Crecencio Mc, MD

## 2022-05-06 NOTE — Assessment & Plan Note (Signed)

## 2022-05-06 NOTE — Patient Instructions (Signed)
Tetanus vaccine is due again in 2015    The ShingRx vaccine is now available in local pharmacies and is much more protective than the old one  Zostavax  (it is about 97%  Effective in preventing shingles). .   It is therefore ADVISED for all interested adults over 50 to prevent shingles so I have printed you a prescription for it.  (it requires a 2nd dose 2 to 6 months after the first one) .  It will cause you to have flu  like symptoms for 2 days If your pharmacy is not available to give it to you,  The Outpatient pharmacy should be able to  give it to you.   You cannot have it on the same day as your high dose flu vaccine

## 2022-05-06 NOTE — Assessment & Plan Note (Signed)
Likely related to anxiety regarding son's failure to launch.  Checking thyroid and CBC

## 2022-05-06 NOTE — Assessment & Plan Note (Signed)
Doing well on wellbutrin 300 mg  Daily .  Depression screen currently negative .  Prefers to remain on medication

## 2022-05-07 LAB — HEPATITIS C ANTIBODY
Hepatitis C Ab: NONREACTIVE
SIGNAL TO CUT-OFF: 0.09 (ref <1.00)

## 2022-05-16 ENCOUNTER — Other Ambulatory Visit: Payer: Self-pay | Admitting: Internal Medicine

## 2022-05-17 ENCOUNTER — Other Ambulatory Visit (HOSPITAL_COMMUNITY): Payer: Self-pay

## 2022-05-17 MED ORDER — BUPROPION HCL ER (XL) 300 MG PO TB24
ORAL_TABLET | Freq: Every day | ORAL | 1 refills | Status: DC
  Filled 2022-05-17: qty 90, 90d supply, fill #0
  Filled 2022-08-16: qty 90, 90d supply, fill #1

## 2022-05-31 DIAGNOSIS — R5383 Other fatigue: Secondary | ICD-10-CM | POA: Diagnosis not present

## 2022-05-31 DIAGNOSIS — N951 Menopausal and female climacteric states: Secondary | ICD-10-CM | POA: Diagnosis not present

## 2022-06-02 DIAGNOSIS — G479 Sleep disorder, unspecified: Secondary | ICD-10-CM | POA: Diagnosis not present

## 2022-06-02 DIAGNOSIS — R232 Flushing: Secondary | ICD-10-CM | POA: Diagnosis not present

## 2022-06-02 DIAGNOSIS — N951 Menopausal and female climacteric states: Secondary | ICD-10-CM | POA: Diagnosis not present

## 2022-06-02 DIAGNOSIS — Z6830 Body mass index (BMI) 30.0-30.9, adult: Secondary | ICD-10-CM | POA: Diagnosis not present

## 2022-07-07 ENCOUNTER — Ambulatory Visit
Admission: RE | Admit: 2022-07-07 | Discharge: 2022-07-07 | Disposition: A | Discharge status: Home / Self Care | Payer: 59 | Source: Ambulatory Visit | Attending: Internal Medicine | Admitting: Internal Medicine

## 2022-07-07 DIAGNOSIS — Z1231 Encounter for screening mammogram for malignant neoplasm of breast: Secondary | ICD-10-CM | POA: Diagnosis not present

## 2022-07-14 ENCOUNTER — Encounter (INDEPENDENT_AMBULATORY_CARE_PROVIDER_SITE_OTHER): Payer: Self-pay

## 2022-07-31 IMAGING — MG MM DIGITAL SCREENING BILAT W/ TOMO AND CAD
8 series · 9 of 24 positions shown · non-contrast
Comparison: Previous exam(s).

CLINICAL DATA: Screening.

EXAM:
DIGITAL SCREENING BILATERAL MAMMOGRAM WITH TOMOSYNTHESIS AND CAD
TECHNIQUE: Bilateral screening digital craniocaudal and mediolateral oblique
mammograms were obtained. Bilateral screening digital breast
tomosynthesis was performed. The images were evaluated with
computer-aided detection.

[R MLO synth-2D]
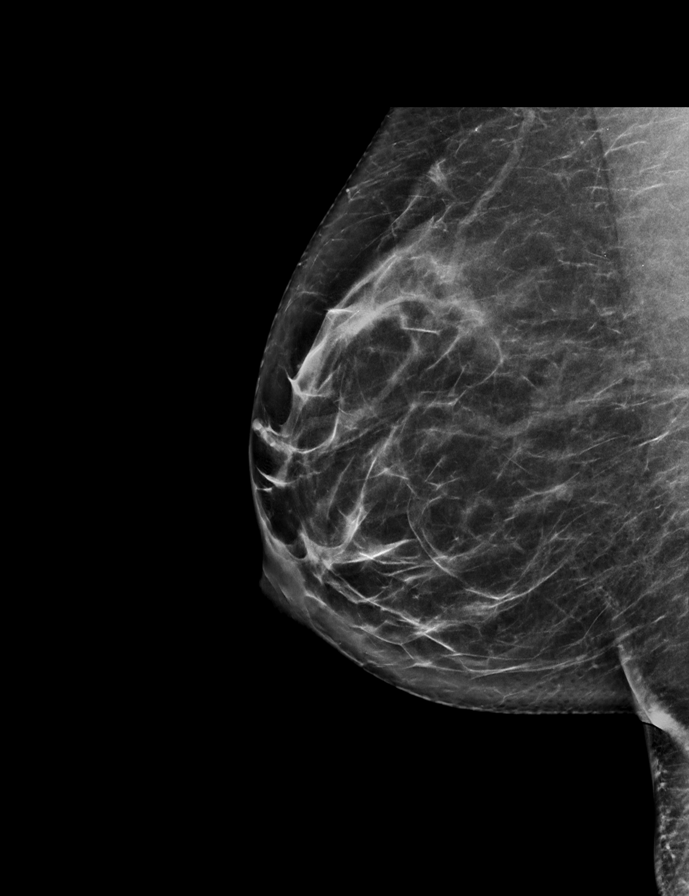

[L MLO synth-2D]
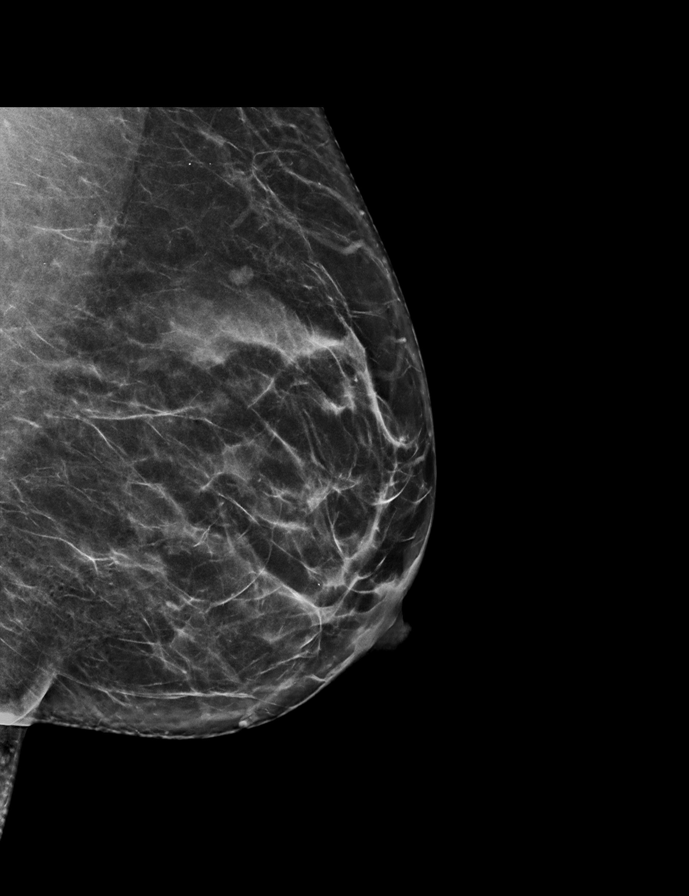

[R CC synth-2D]
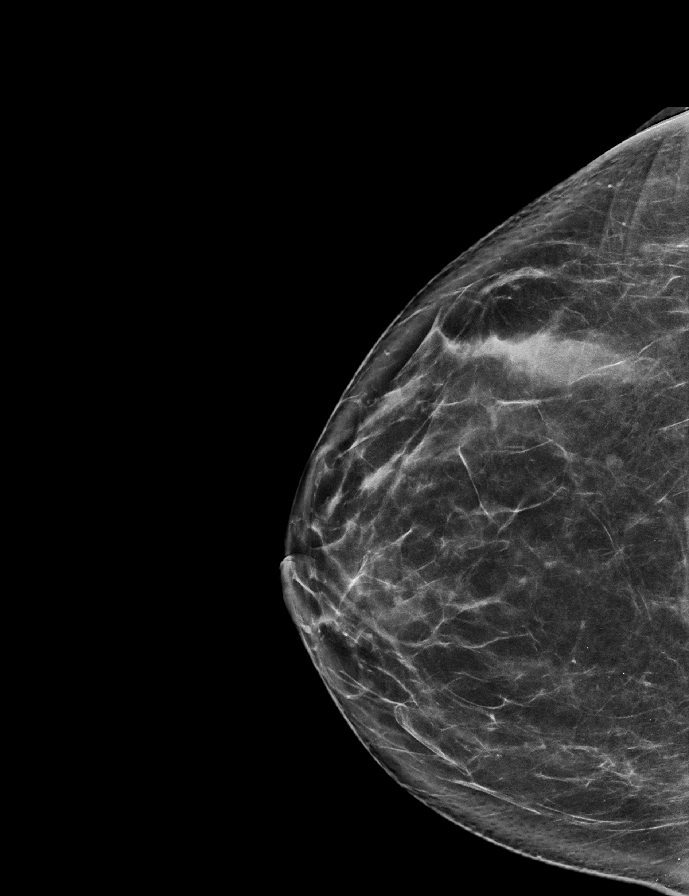

[L CC synth-2D]
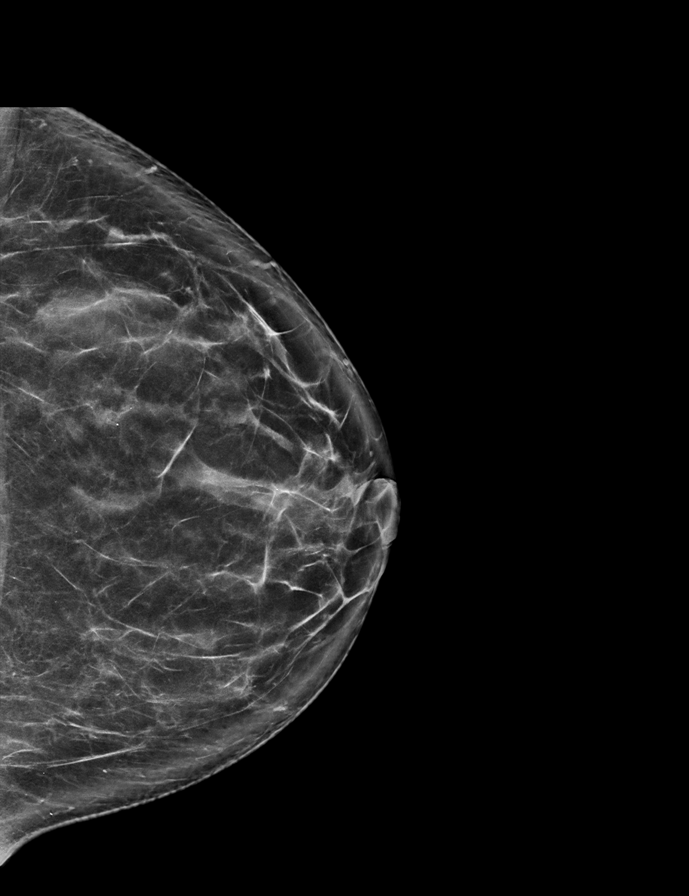

[L MLO tomo · 2 of 69 frames shown]
[frame 23/69]
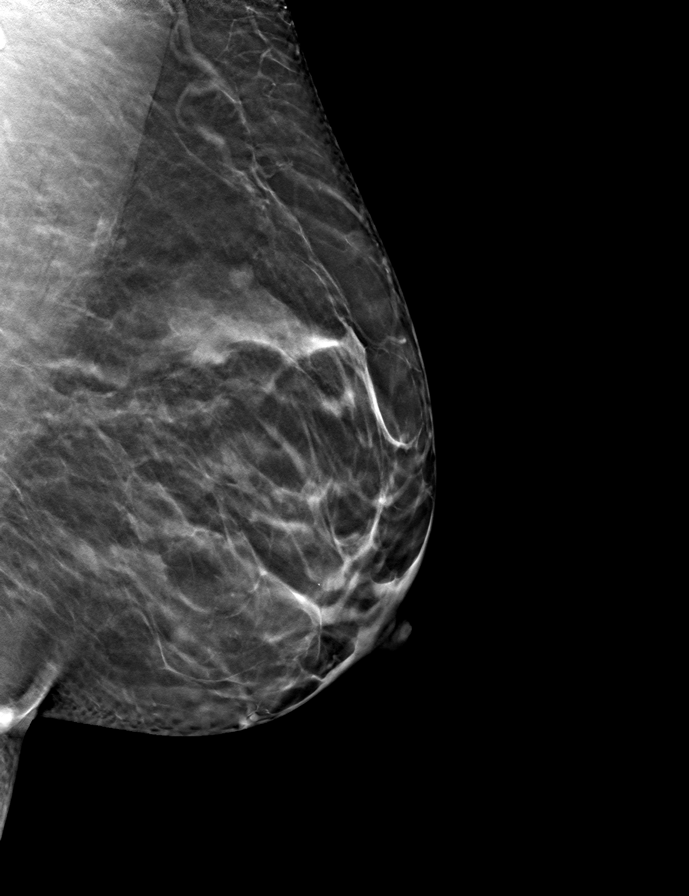
[frame 35/69]
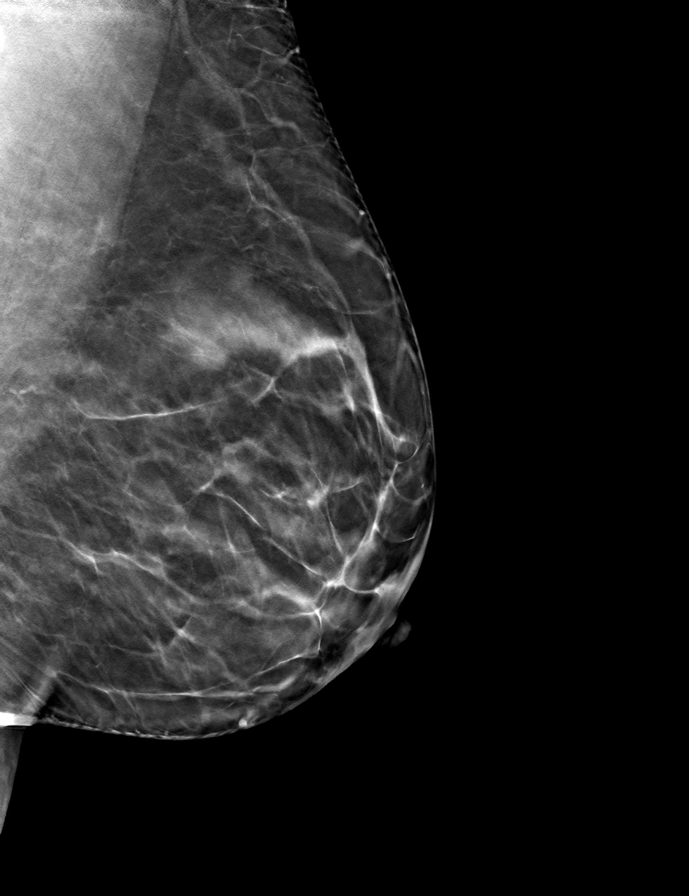

[R MLO tomo · tomo slice 36/71.0]
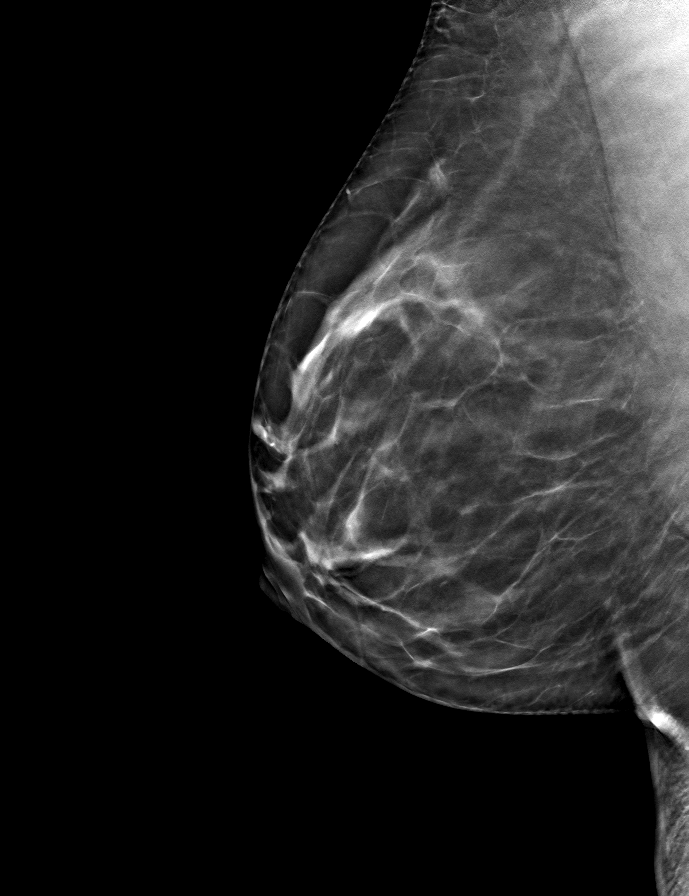

[L CC tomo · tomo slice 39/77.0]
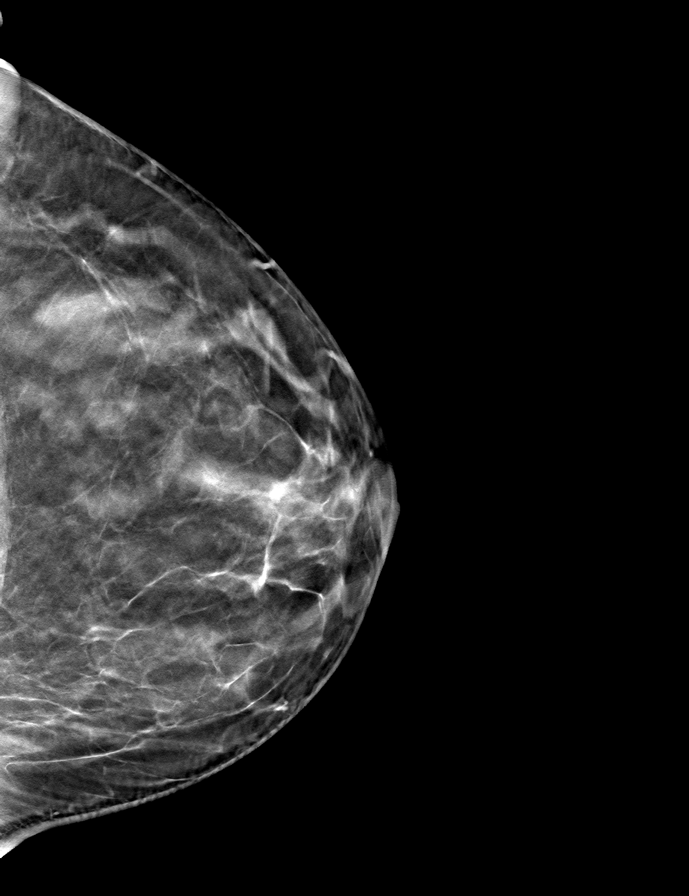

[R CC tomo · tomo slice 39/76.0]
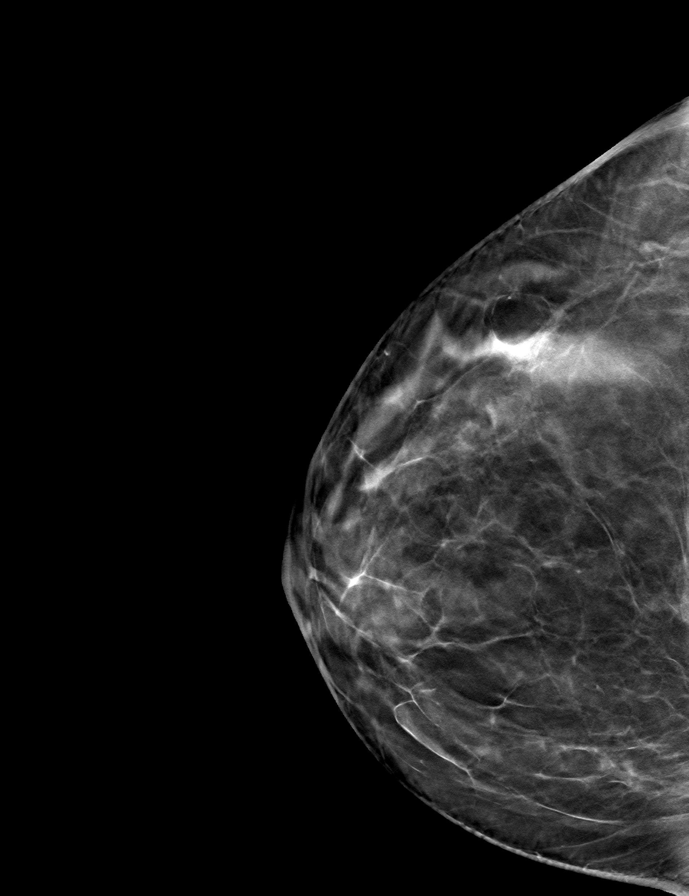

[9 of 24 positions shown; findings below may reference images not displayed]

ACR Breast Density Category b: There are scattered areas of
fibroglandular density.
FINDINGS: There are no findings suspicious for malignancy.
IMPRESSION: No mammographic evidence of malignancy. A result letter of this
screening mammogram will be mailed directly to the patient.

RECOMMENDATION:
Screening mammogram in one year. (Code:51-O-LD2)

BI-RADS CATEGORY  1: Negative.

## 2022-08-11 ENCOUNTER — Other Ambulatory Visit (HOSPITAL_COMMUNITY): Payer: Self-pay

## 2022-08-11 MED ORDER — ZOSTER VAC RECOMB ADJUVANTED 50 MCG/0.5ML IM SUSR
INTRAMUSCULAR | 1 refills | Status: DC
  Filled 2022-08-11: qty 1, 1d supply, fill #0
  Filled 2022-10-18 – 2022-11-01 (×2): qty 1, 1d supply, fill #1

## 2022-08-13 ENCOUNTER — Other Ambulatory Visit (HOSPITAL_COMMUNITY): Payer: Self-pay

## 2022-08-16 ENCOUNTER — Other Ambulatory Visit (HOSPITAL_COMMUNITY): Payer: Self-pay

## 2022-08-30 DIAGNOSIS — R5383 Other fatigue: Secondary | ICD-10-CM | POA: Diagnosis not present

## 2022-08-30 DIAGNOSIS — N951 Menopausal and female climacteric states: Secondary | ICD-10-CM | POA: Diagnosis not present

## 2022-09-01 DIAGNOSIS — R232 Flushing: Secondary | ICD-10-CM | POA: Diagnosis not present

## 2022-09-01 DIAGNOSIS — N951 Menopausal and female climacteric states: Secondary | ICD-10-CM | POA: Diagnosis not present

## 2022-09-01 DIAGNOSIS — Z6828 Body mass index (BMI) 28.0-28.9, adult: Secondary | ICD-10-CM | POA: Diagnosis not present

## 2022-09-01 DIAGNOSIS — G479 Sleep disorder, unspecified: Secondary | ICD-10-CM | POA: Diagnosis not present

## 2022-09-27 ENCOUNTER — Other Ambulatory Visit (HOSPITAL_BASED_OUTPATIENT_CLINIC_OR_DEPARTMENT_OTHER): Payer: Self-pay

## 2022-09-27 MED ORDER — COMIRNATY 30 MCG/0.3ML IM SUSY
PREFILLED_SYRINGE | INTRAMUSCULAR | 0 refills | Status: DC
  Filled 2022-09-27: qty 0.3, 1d supply, fill #0

## 2022-09-28 ENCOUNTER — Other Ambulatory Visit (HOSPITAL_BASED_OUTPATIENT_CLINIC_OR_DEPARTMENT_OTHER): Payer: Self-pay

## 2022-10-13 DIAGNOSIS — Z79890 Hormone replacement therapy (postmenopausal): Secondary | ICD-10-CM | POA: Diagnosis not present

## 2022-10-13 DIAGNOSIS — G43909 Migraine, unspecified, not intractable, without status migrainosus: Secondary | ICD-10-CM | POA: Diagnosis not present

## 2022-10-13 DIAGNOSIS — N951 Menopausal and female climacteric states: Secondary | ICD-10-CM | POA: Diagnosis not present

## 2022-10-13 DIAGNOSIS — Z6828 Body mass index (BMI) 28.0-28.9, adult: Secondary | ICD-10-CM | POA: Diagnosis not present

## 2022-10-13 DIAGNOSIS — G479 Sleep disorder, unspecified: Secondary | ICD-10-CM | POA: Diagnosis not present

## 2022-10-13 DIAGNOSIS — R232 Flushing: Secondary | ICD-10-CM | POA: Diagnosis not present

## 2022-10-13 DIAGNOSIS — R6882 Decreased libido: Secondary | ICD-10-CM | POA: Diagnosis not present

## 2022-10-18 ENCOUNTER — Other Ambulatory Visit (HOSPITAL_COMMUNITY): Payer: Self-pay

## 2022-11-01 ENCOUNTER — Other Ambulatory Visit (HOSPITAL_COMMUNITY): Payer: Self-pay

## 2022-11-04 ENCOUNTER — Other Ambulatory Visit (HOSPITAL_COMMUNITY): Payer: Self-pay

## 2022-11-12 ENCOUNTER — Other Ambulatory Visit: Payer: Self-pay | Admitting: Internal Medicine

## 2022-11-12 ENCOUNTER — Other Ambulatory Visit (HOSPITAL_COMMUNITY): Payer: Self-pay

## 2022-11-12 MED ORDER — BUPROPION HCL ER (XL) 300 MG PO TB24
ORAL_TABLET | Freq: Every day | ORAL | 1 refills | Status: DC
  Filled 2022-11-12: qty 90, 90d supply, fill #0
  Filled 2023-02-21: qty 90, 90d supply, fill #1

## 2022-12-02 ENCOUNTER — Telehealth: Payer: 59 | Admitting: Family Medicine

## 2022-12-02 ENCOUNTER — Other Ambulatory Visit: Payer: Self-pay

## 2022-12-02 DIAGNOSIS — J019 Acute sinusitis, unspecified: Secondary | ICD-10-CM | POA: Diagnosis not present

## 2022-12-02 DIAGNOSIS — B9689 Other specified bacterial agents as the cause of diseases classified elsewhere: Secondary | ICD-10-CM

## 2022-12-02 MED ORDER — AMOXICILLIN-POT CLAVULANATE 875-125 MG PO TABS
1.0000 | ORAL_TABLET | Freq: Two times a day (BID) | ORAL | 0 refills | Status: AC
  Filled 2022-12-02: qty 3, 2d supply, fill #0
  Filled 2022-12-02: qty 11, 5d supply, fill #0

## 2022-12-02 NOTE — Progress Notes (Signed)

## 2023-02-09 DIAGNOSIS — Z6829 Body mass index (BMI) 29.0-29.9, adult: Secondary | ICD-10-CM | POA: Diagnosis not present

## 2023-02-09 DIAGNOSIS — Z79890 Hormone replacement therapy (postmenopausal): Secondary | ICD-10-CM | POA: Diagnosis not present

## 2023-02-09 DIAGNOSIS — N951 Menopausal and female climacteric states: Secondary | ICD-10-CM | POA: Diagnosis not present

## 2023-02-09 DIAGNOSIS — G43909 Migraine, unspecified, not intractable, without status migrainosus: Secondary | ICD-10-CM | POA: Diagnosis not present

## 2023-02-09 DIAGNOSIS — F329 Major depressive disorder, single episode, unspecified: Secondary | ICD-10-CM | POA: Diagnosis not present

## 2023-02-09 DIAGNOSIS — F419 Anxiety disorder, unspecified: Secondary | ICD-10-CM | POA: Diagnosis not present

## 2023-02-09 DIAGNOSIS — R5383 Other fatigue: Secondary | ICD-10-CM | POA: Diagnosis not present

## 2023-02-21 ENCOUNTER — Other Ambulatory Visit (HOSPITAL_COMMUNITY): Payer: Self-pay

## 2023-05-11 ENCOUNTER — Other Ambulatory Visit: Payer: Self-pay

## 2023-05-11 ENCOUNTER — Ambulatory Visit (INDEPENDENT_AMBULATORY_CARE_PROVIDER_SITE_OTHER): Payer: 59

## 2023-05-11 ENCOUNTER — Ambulatory Visit (INDEPENDENT_AMBULATORY_CARE_PROVIDER_SITE_OTHER): Payer: 59 | Admitting: Internal Medicine

## 2023-05-11 ENCOUNTER — Encounter: Payer: Self-pay | Admitting: Internal Medicine

## 2023-05-11 VITALS — BP 120/64 | HR 73 | Temp 97.9°F | Ht 62.0 in | Wt 162.2 lb

## 2023-05-11 DIAGNOSIS — F330 Major depressive disorder, recurrent, mild: Secondary | ICD-10-CM

## 2023-05-11 DIAGNOSIS — M7989 Other specified soft tissue disorders: Secondary | ICD-10-CM | POA: Diagnosis not present

## 2023-05-11 DIAGNOSIS — E785 Hyperlipidemia, unspecified: Secondary | ICD-10-CM | POA: Diagnosis not present

## 2023-05-11 DIAGNOSIS — Z1231 Encounter for screening mammogram for malignant neoplasm of breast: Secondary | ICD-10-CM

## 2023-05-11 DIAGNOSIS — R7301 Impaired fasting glucose: Secondary | ICD-10-CM

## 2023-05-11 DIAGNOSIS — Z0000 Encounter for general adult medical examination without abnormal findings: Secondary | ICD-10-CM | POA: Diagnosis not present

## 2023-05-11 DIAGNOSIS — M67442 Ganglion, left hand: Secondary | ICD-10-CM

## 2023-05-11 DIAGNOSIS — M67449 Ganglion, unspecified hand: Secondary | ICD-10-CM | POA: Insufficient documentation

## 2023-05-11 DIAGNOSIS — Z789 Other specified health status: Secondary | ICD-10-CM | POA: Diagnosis not present

## 2023-05-11 DIAGNOSIS — F418 Other specified anxiety disorders: Secondary | ICD-10-CM | POA: Diagnosis not present

## 2023-05-11 DIAGNOSIS — R5383 Other fatigue: Secondary | ICD-10-CM

## 2023-05-11 MED ORDER — DOXYCYCLINE HYCLATE 100 MG PO TABS
100.0000 mg | ORAL_TABLET | Freq: Two times a day (BID) | ORAL | 0 refills | Status: DC
  Filled 2023-05-11: qty 14, 7d supply, fill #0

## 2023-05-11 MED ORDER — BUPROPION HCL ER (XL) 300 MG PO TB24
300.0000 mg | ORAL_TABLET | Freq: Every day | ORAL | 1 refills | Status: DC
  Filled 2023-05-11: qty 90, 90d supply, fill #0
  Filled 2023-09-02: qty 90, 90d supply, fill #1

## 2023-05-11 NOTE — Assessment & Plan Note (Addendum)
Left index finger,  by appearance .  Plain films done There is no official read /report, but I note  loss of joint space  noted no bony involvement

## 2023-05-11 NOTE — Assessment & Plan Note (Signed)
Copuy placed in chart

## 2023-05-11 NOTE — Patient Instructions (Addendum)
Hip pain sounds like OA . Try adding tylenol to your current nighttime regimen of ibuprofen.   You can add up to 2000 mg of acetominophen (tylenol) every day safely  In divided doses ( 1000 mg every 12 hours.)    [facility] Spotted Fever  can present with fever and a headache, NOT a rash; s the occurrence of these symptoms during spring/summer/fall in the context of recent tick exposure is RMSF until proven otherwise and should be treated immediately with doxycycline.  I have sent this rx to your pharmacy to use if you develop these symptoms.  Let me know if this occurs so we can set you up for the appropriate testing .   The cyst on your finger looks like a mucinous digit cyst.

## 2023-05-11 NOTE — Assessment & Plan Note (Signed)
She has designated her oldest son as POA.  Copy placed in chart

## 2023-05-11 NOTE — Assessment & Plan Note (Signed)
Improved with wellbutrin.  She is taking time to relax weekly with reacreational activities (pottery)

## 2023-05-11 NOTE — Assessment & Plan Note (Signed)

## 2023-05-11 NOTE — Progress Notes (Signed)
Patient ID: Heather Pratt, female    DOB: 1971-08-29  Age: 52 y.o. MRN: [id number]  The patient is here for annual preventive examination and management of other chronic and acute problems.   The risk factors are reflected in the social history.   The roster of all physicians providing medical care to patient - is listed in the Snapshot section of the chart.   Activities of daily living:  The patient is 100% independent in all ADLs: dressing, toileting, feeding as well as independent mobility   Home safety : The patient has smoke detectors in the home. They wear seatbelts.  There are no unsecured firearms at home. There is no violence in the home.    There is no risks for hepatitis, STDs or HIV. There is no   history of blood transfusion. They have no travel history to infectious disease endemic areas of the world.   The patient has seen their dentist in the last six month. They have seen their eye doctor in the last year. The patinet  denies slight hearing difficulty with regard to whispered voices and some television programs.  They have deferred audiologic testing in the last year.  They do not  have excessive sun exposure. Discussed the need for sun protection: hats, long sleeves and use of sunscreen if there is significant sun exposure.    Diet: the importance of a healthy diet is discussed. They do have a healthy diet.   The benefits of regular aerobic exercise were discussed. The patient  exercises  3 to 5 days per week  for  60 minutes.    Depression screen: there are no signs or vegative symptoms of depression- irritability, change in appetite, anhedonia, sadness/tearfullness.   The following portions of the patient's history were reviewed and updated as appropriate: allergies, current medications, past family history, past medical history,  past surgical history, past social history  and problem list.   Visual acuity was not assessed per patient preference since the patient  has regular follow up with an  ophthalmologist. Hearing and body mass index were assessed and reviewed.    During the course of the visit the patient was educated and counseled about appropriate screening and preventive services including : fall prevention , diabetes screening, nutrition counseling, colorectal cancer screening, and recommended immunizations.    Chief Complaint:   1) painful cyst on left index finger ,  has been present for 6 months despite wraps,  ice.   Referral deferred  occupational hazard as PT   2)  hip pain right side for 10 yrs, keeping her awake at night despite using ibuprofen 600 mg .  Has not tried tylenol   3) lifts weights  3 times daily . Walks several times per week    Review of Symptoms  Patient denies headache, fevers, malaise, unintentional weight loss, skin rash, eye pain, sinus congestion and sinus pain, sore throat, dysphagia,  hemoptysis , cough, dyspnea, wheezing, chest pain, palpitations, orthopnea, edema, abdominal pain, nausea, melena, diarrhea, constipation, flank pain, dysuria, hematuria, urinary  Frequency, nocturia, numbness, tingling, seizures,  Focal weakness, Loss of consciousness,  Tremor, insomnia, depression, anxiety, and suicidal ideation.    Physical Exam:  BP 120/64   Pulse 73   Temp 97.9 F (36.6 C) (Oral)   Ht 5\' 2"  (1.575 m)   Wt 162 lb 3.2 oz (73.6 kg)   SpO2 98%   BMI 29.67 kg/m    Physical Exam Vitals reviewed.  Constitutional:  General: She is not in acute distress.    Appearance: Normal appearance. She is well-developed and normal weight. She is not ill-appearing, toxic-appearing or diaphoretic.  HENT:     Head: Normocephalic.     Right Ear: Tympanic membrane, ear canal and external ear normal. There is no impacted cerumen.     Left Ear: Tympanic membrane, ear canal and external ear normal. There is no impacted cerumen.     Nose: Nose normal.     Mouth/Throat:     Mouth: Mucous membranes are moist.      Pharynx: Oropharynx is clear.  Eyes:     General: No scleral icterus.       Right eye: No discharge.        Left eye: No discharge.     Conjunctiva/sclera: Conjunctivae normal.     Pupils: Pupils are equal, round, and reactive to light.  Neck:     Thyroid: No thyromegaly.     Vascular: No carotid bruit or JVD.  Cardiovascular:     Rate and Rhythm: Normal rate and regular rhythm.     Heart sounds: Normal heart sounds.  Pulmonary:     Effort: Pulmonary effort is normal. No respiratory distress.     Breath sounds: Normal breath sounds.  Chest:  Breasts:    Breasts are symmetrical.     Right: Normal. No swelling, inverted nipple, mass, nipple discharge, skin change or tenderness.     Left: Normal. No swelling, inverted nipple, mass, nipple discharge, skin change or tenderness.  Abdominal:     General: Bowel sounds are normal.     Palpations: Abdomen is soft. There is no mass.     Tenderness: There is no abdominal tenderness. There is no guarding or rebound.  Musculoskeletal:        General: Normal range of motion.     Cervical back: Normal range of motion and neck supple.  Lymphadenopathy:     Cervical: No cervical adenopathy.     Upper Body:     Right upper body: No supraclavicular, axillary or pectoral adenopathy.     Left upper body: No supraclavicular, axillary or pectoral adenopathy.  Skin:    General: Skin is warm and dry.  Neurological:     General: No focal deficit present.     Mental Status: She is alert and oriented to person, place, and time. Mental status is at baseline.  Psychiatric:        Mood and Affect: Mood normal.        Behavior: Behavior normal.        Thought Content: Thought content normal.        Judgment: Judgment normal.    Assessment and Plan: Encounter for screening mammogram for malignant neoplasm of breast -     3D Screening Mammogram, Left and Right; Future  Fatigue, unspecified type -     TSH -     CBC with  Differential/Platelet  Encounter for preventive health examination Assessment & Plan: age appropriate education and counseling updated, referrals for preventative services and immunizations addressed, dietary and smoking counseling addressed, most recent labs reviewed.  I have personally reviewed and have noted:   1) the patient's medical and social history 2) The pt's use of alcohol, tobacco, and illicit drugs 3) The patient's current medications and supplements 4) Functional ability including ADL's, fall risk, home safety risk, hearing and visual impairment 5) Diet and physical activities 6) Evidence for depression or mood disorder 7) The patient's height,  weight, and BMI have been recorded in the chart   I have made referrals, and provided counseling and education based on review of the above    Hyperlipidemia, unspecified hyperlipidemia type Assessment & Plan: 10 yr risk of CAD using the AHA risk calculator is 1 %.  Patient has no incidental evidence of atherosclerosis .  No treatment advised at this time   Lab Results  Component Value Date   CHOL 221 (H) 05/11/2023   HDL 64.00 05/11/2023   LDLCALC 148 (H) 05/11/2023   LDLDIRECT 170.0 05/11/2023   TRIG 44.0 05/11/2023   CHOLHDL 3 05/11/2023     Orders: -     Lipid panel -     LDL cholesterol, direct  Impaired fasting glucose -     Comprehensive metabolic panel -     Hemoglobin A1c  Durable power of attorney for healthcare Assessment & Plan: She has designated her oldest son as POA.  Copy placed in chart   Living will in place Assessment & Plan: Copuy placed in chart    Digital mucinous cyst of finger Assessment & Plan: Left index finger,  by appearance .  Plain films done There is no official read /report, but I note  loss of joint space  noted no bony involvement   Orders: -     DG Finger Index Left; Future  Anxiety associated with depression Assessment & Plan: Managed with wellbutrin .  No changes based  on assessment today. She is engaging in relaxing activities (pottery)  as well    Mild episode of recurrent major depressive disorder (HCC) Assessment & Plan: Improved with wellbutrin.  She is taking time to relax weekly with reacreational activities (pottery)   Other orders -     buPROPion HCl ER (XL); Take 1 tablet (300 mg total) by mouth daily.  Dispense: 90 tablet; Refill: 1 -     Doxycycline Hyclate; Take 1 tablet (100 mg total) by mouth 2 (two) times daily.  Dispense: 14 tablet; Refill: 0    No follow-ups on file.  Sherlene Shams, MD

## 2023-05-11 NOTE — Assessment & Plan Note (Signed)
Managed with wellbutrin .  No changes based on assessment today. She is engaging in relaxing activities (pottery)  as well

## 2023-05-12 ENCOUNTER — Other Ambulatory Visit: Payer: Self-pay

## 2023-05-12 LAB — COMPREHENSIVE METABOLIC PANEL
ALT: 11 U/L (ref 0–35)
AST: 16 U/L (ref 0–37)
Albumin: 4.2 g/dL (ref 3.5–5.2)
Alkaline Phosphatase: 49 U/L (ref 39–117)
BUN: 17 mg/dL (ref 6–23)
CO2: 24 mEq/L (ref 19–32)
Calcium: 9.3 mg/dL (ref 8.4–10.5)
Chloride: 105 mEq/L (ref 96–112)
Creatinine, Ser: 0.8 mg/dL (ref 0.40–1.20)
GFR: 85.49 mL/min (ref >60.00)
Glucose, Bld: 79 mg/dL (ref 70–99)
Potassium: 4.2 mEq/L (ref 3.5–5.1)
Sodium: 138 mEq/L (ref 135–145)
Total Bilirubin: 0.4 mg/dL (ref 0.2–1.2)
Total Protein: 7.5 g/dL (ref 6.0–8.3)

## 2023-05-12 LAB — CBC WITH DIFFERENTIAL/PLATELET
Basophils Absolute: 0.1 10*3/uL (ref 0.0–0.1)
Basophils Relative: 1.3 % (ref 0.0–3.0)
Eosinophils Absolute: 0.4 10*3/uL (ref 0.0–0.7)
Eosinophils Relative: 5.4 % — ABNORMAL HIGH (ref 0.0–5.0)
HCT: 38.6 % (ref 36.0–46.0)
Hemoglobin: 12.6 g/dL (ref 12.0–15.0)
Lymphocytes Relative: 35.9 % (ref 12.0–46.0)
Lymphs Abs: 2.5 10*3/uL (ref 0.7–4.0)
MCHC: 32.7 g/dL (ref 30.0–36.0)
MCV: 89.3 fl (ref 78.0–100.0)
Monocytes Absolute: 0.7 10*3/uL (ref 0.1–1.0)
Monocytes Relative: 10.4 % (ref 3.0–12.0)
Neutro Abs: 3.2 10*3/uL (ref 1.4–7.7)
Neutrophils Relative %: 47 % (ref 43.0–77.0)
Platelets: 249 10*3/uL (ref 150.0–400.0)
RBC: 4.32 Mil/uL (ref 3.87–5.11)
RDW: 14.1 % (ref 11.5–15.5)
WBC: 6.8 10*3/uL (ref 4.0–10.5)

## 2023-05-12 LAB — LIPID PANEL
Cholesterol: 221 mg/dL — ABNORMAL HIGH (ref 0–200)
HDL: 64 mg/dL (ref >39.00)
LDL Cholesterol: 148 mg/dL — ABNORMAL HIGH (ref 0–99)
NonHDL: 156.88
Total CHOL/HDL Ratio: 3
Triglycerides: 44 mg/dL (ref 0.0–149.0)
VLDL: 8.8 mg/dL (ref 0.0–40.0)

## 2023-05-12 LAB — LDL CHOLESTEROL, DIRECT: Direct LDL: 170 mg/dL

## 2023-05-12 LAB — TSH: TSH: 1.53 u[IU]/mL (ref 0.35–5.50)

## 2023-05-12 LAB — HEMOGLOBIN A1C: Hgb A1c MFr Bld: 5.5 % (ref 4.6–6.5)

## 2023-05-13 DIAGNOSIS — E785 Hyperlipidemia, unspecified: Secondary | ICD-10-CM | POA: Insufficient documentation

## 2023-05-13 NOTE — Assessment & Plan Note (Signed)
10 yr risk of CAD using the AHA risk calculator is 1 %.  Patient has no incidental evidence of atherosclerosis .  No treatment advised at this time   Lab Results  Component Value Date   CHOL 221 (H) 05/11/2023   HDL 64.00 05/11/2023   LDLCALC 148 (H) 05/11/2023   LDLDIRECT 170.0 05/11/2023   TRIG 44.0 05/11/2023   CHOLHDL 3 05/11/2023

## 2023-05-24 ENCOUNTER — Ambulatory Visit
Admission: RE | Admit: 2023-05-24 | Discharge: 2023-05-24 | Disposition: A | Discharge status: Home / Self Care | Payer: 59 | Source: Ambulatory Visit | Attending: Urgent Care | Admitting: Urgent Care

## 2023-05-24 VITALS — BP 137/77 | HR 69 | Temp 98.5°F | Resp 16

## 2023-05-24 DIAGNOSIS — H6502 Acute serous otitis media, left ear: Secondary | ICD-10-CM

## 2023-05-24 DIAGNOSIS — H6992 Unspecified Eustachian tube disorder, left ear: Secondary | ICD-10-CM | POA: Diagnosis not present

## 2023-05-24 DIAGNOSIS — H60502 Unspecified acute noninfective otitis externa, left ear: Secondary | ICD-10-CM

## 2023-05-24 MED ORDER — PREDNISONE 10 MG (21) PO TBPK: ORAL_TABLET | Freq: Every day | ORAL | 0 refills | Status: DC

## 2023-05-24 MED ORDER — CIPROFLOXACIN-DEXAMETHASONE 0.3-0.1 % OT SUSP: 4.0000 [drp] | Freq: Two times a day (BID) | OTIC | 0 refills | Status: DC

## 2023-05-24 MED ORDER — CIPROFLOXACIN-DEXAMETHASONE 0.3-0.1 % OT SUSP: 4.0000 [drp] | Freq: Two times a day (BID) | OTIC | 0 refills | Status: AC

## 2023-05-24 NOTE — Discharge Instructions (Signed)
Follow up here or with your primary care provider if your symptoms are worsening or not improving with treatment.     

## 2023-05-24 NOTE — ED Triage Notes (Signed)
Reports a sensation of "feeling like water is running inside my ear" x 10 days, with pain beginning two days ago. 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 Denies recent swimming, fevers. Reports some mild decrease in hearing

## 2023-05-24 NOTE — ED Provider Notes (Signed)
Heather Pratt    CSN: [id number] Arrival date & time: 05/24/23  1802      History   Chief Complaint Chief Complaint  Patient presents with   Ear Fullness    Entered by patient    HPI Heather Pratt is a 52 y.o. female.    Ear Fullness    Presents to urgent care with complaint of sensation of "water running inside my ear" x 10 days.  She reports pain beginning 2 days ago.  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  She denies recent swimming.  Denies fever.  Reports decrease in hearing.  She denies any recent upper respiratory infection symptoms.  No nasal congestion.  No sinus pain or pressure.  Past Medical History:  Diagnosis Date   Allergy    Anxiety    Depression    HLD (hyperlipidemia)    Joint pain    Migraines     Patient Active Problem List   Diagnosis Date Noted   Hyperlipidemia 05/13/2023   Durable power of attorney for healthcare 05/11/2023   Living will in place 05/11/2023   Digital mucinous cyst of finger 05/11/2023   Anxiety associated with depression 11/26/2018   Spasm of muscle of lower back 09/16/2018   Major depressive disorder with current active episode 10/01/2017   Obesity (BMI 30.0-34.9) 10/01/2017   Fatigue 10/01/2017   Encounter for preventive health examination 10/01/2017    Past Surgical History:  Procedure Laterality Date   CESAREAN SECTION WITH BILATERAL TUBAL LIGATION  005   [facility]   ENDOMETRIAL ABLATION  2011   menorrhagia,  [place]    OB History     Gravida  2   Para  0   Term  0   Preterm  0   AB  0   Living         SAB  0   IAB  0   Ectopic  0   Multiple      Live Births               Home Medications    Prior to Admission medications   Medication Sig Start Date End Date Taking? Authorizing Provider  aspirin-acetaminophen-caffeine (EXCEDRIN MIGRAINE) [phone] MG tablet Take 1 tablet by mouth every 6 (six) hours as needed for headache.     [provider]  buPROPion (WELLBUTRIN XL) 300 MG 24 hr tablet Take 1 tablet (300 mg total) by mouth daily. 05/11/23   Sherlene Shams, MD  cetirizine (ZYRTEC) 10 MG tablet Take 10 mg by mouth daily as needed for allergies.    [provider]  COVID-19 mRNA vaccine [phone] (COMIRNATY) syringe Inject into the muscle. 09/27/22   Judyann Munson, MD  DHEA 25 MG tablet Take 25 mg by mouth daily. 01/25/22   [provider]  doxycycline (VIBRA-TABS) 100 MG tablet Take 1 tablet (100 mg total) by mouth 2 (two) times daily. 05/11/23   Sherlene Shams, MD  ibuprofen (ADVIL,MOTRIN) 200 MG tablet Take 200 mg by mouth every 6 (six) hours as needed.    [provider]  loratadine (CLARITIN) 10 MG tablet Take 10 mg by mouth daily as needed for allergies.    [provider]  Magnesium Glycinate 100 MG CAPS Take 2 capsules by mouth daily. 01/25/22   [provider]  melatonin 5 MG TABS Take 5 mg by mouth at bedtime as needed. 05/29/22   [provider]  traZODone (DESYREL) 50 MG tablet Take 50 mg by mouth at bedtime as needed. 05/06/21   [provider]  Vitamin D-Vitamin K (VITAMIN K2-VITAMIN D3 PO) Take 1 tablet by mouth daily. 01/25/22   [provider]  Zoster Vaccine Adjuvanted [facility]) injection Inject into the muscle. 08/11/22   Judyann Munson, MD    Family History Family History  Problem Relation Age of Onset   Hypertension Mother    Hyperlipidemia Mother    Thyroid disease Mother    Alcohol abuse Father    Hypertension Father    Hyperlipidemia Father    Depression Father    Eating disorder Father    Obesity Father    Hypertension Maternal Grandmother    Heart disease Maternal Grandmother    Prostate cancer Maternal Grandfather    Heart disease Maternal Grandfather    Hypertension Maternal Grandfather     Social History Social History   Tobacco Use   Smoking status: Never   Smokeless tobacco: Never  Vaping Use    Vaping Use: Never used  Substance Use Topics   Alcohol use: Yes    Alcohol/week: 2.0 standard drinks of alcohol    Types: 1 Cans of beer, 1 Glasses of wine per week   Drug use: No     Allergies   Macrobid [nitrofurantoin]   Review of Systems Review of Systems   Physical Exam Triage Vital Signs ED Triage Vitals  Enc Vitals Group     BP 05/24/23 1830 137/77     Pulse Rate 05/24/23 1830 69     Resp 05/24/23 1830 16     Temp 05/24/23 1830 98.5 F (36.9 C)     Temp Source 05/24/23 1830 Oral     SpO2 05/24/23 1830 98 %     Weight --      Height --      Head Circumference --      Peak Flow --      Pain Score 05/24/23 1833 2     Pain Loc --      Pain Edu? --      Excl. in GC? --    No data found.  Updated Vital Signs BP 137/77 (BP Location: Left Arm)   Pulse 69   Temp 98.5 F (36.9 C) (Oral)   Resp 16   SpO2 98%   Visual Acuity Right Eye Distance:   Left Eye Distance:   Bilateral Distance:    Right Eye Near:   Left Eye Near:    Bilateral Near:     Physical Exam Vitals reviewed.  Constitutional:      Appearance: Normal appearance.  HENT:     Right Ear: Hearing and tympanic membrane normal. No middle ear effusion. Tympanic membrane is not erythematous.     Left Ear: Decreased hearing noted. Tenderness present. A middle ear effusion is present. Tympanic membrane is not bulging.     Ears:     Comments: Right TM is transparent with landmarks clearly visible and light reflex.  Left TM is somewhat opaque especially in the 7:00 region with a bubble visible.  There is some erythema in the left EAC.  There is mild tragal tenderness on the left. Skin:    General: Skin is warm and dry.  Neurological:     General: No focal deficit present.     Mental Status: She is alert and oriented to person, place, and time.  Psychiatric:        Mood and Affect: Mood  normal.        Behavior: Behavior normal.      UC Treatments / Results  Labs (all labs ordered are listed,  but only abnormal results are displayed) Labs Reviewed - No data to display  EKG   Radiology No results found.  Procedures Procedures (including critical care time)  Medications Ordered in UC Medications - No data to display  Initial Impression / Assessment and Plan / UC Course  I have reviewed the triage vital signs and the nursing notes.  Pertinent labs & imaging results that were available during my care of the patient were reviewed by me and considered in my medical decision making (see chart for details).   Georgiann Furio is a 52 y.o. female presenting with L ear "fullness". Patient is afebrile without recent antipyretics, satting well on room air. Overall is well appearing, well hydrated, without respiratory distress. Right TM is transparent with landmarks clearly visible and light reflex.  Left TM is somewhat opaque especially in the 7:00 region with a bubble visible.  There is some erythema in the left EAC.  There is mild tragal tenderness on the left.  Reviewed relevant chart history.   Suspect left eustachian tube dysfunction of unknown etiology.  Left EAC is mildly erythematous but without any tissue breakdown or exudate present.  There is tragal tenderness so I suspect some inflammatory source.  Will treat with Ciprodex for possible otitis externa but recommended treatment with p.o. prednisone to reduce inflammation that may be causing eustachian tube dysfunction.  Counseled patient on potential for adverse effects with medications prescribed/recommended today, ER and return-to-clinic precautions discussed, patient verbalized understanding and agreement with care plan.  Final Clinical Impressions(s) / UC Diagnoses   Final diagnoses:  None   Discharge Instructions   None    ED Prescriptions   None    PDMP not reviewed this encounter.   Charma Igo, [place] 05/24/23 1916

## 2023-05-25 ENCOUNTER — Other Ambulatory Visit (HOSPITAL_COMMUNITY): Payer: Self-pay

## 2023-06-06 DIAGNOSIS — N951 Menopausal and female climacteric states: Secondary | ICD-10-CM | POA: Diagnosis not present

## 2023-06-14 DIAGNOSIS — H6982 Other specified disorders of Eustachian tube, left ear: Secondary | ICD-10-CM | POA: Diagnosis not present

## 2023-06-14 DIAGNOSIS — H903 Sensorineural hearing loss, bilateral: Secondary | ICD-10-CM | POA: Diagnosis not present

## 2023-06-15 ENCOUNTER — Other Ambulatory Visit (HOSPITAL_COMMUNITY): Payer: Self-pay

## 2023-06-15 DIAGNOSIS — R232 Flushing: Secondary | ICD-10-CM | POA: Diagnosis not present

## 2023-06-15 DIAGNOSIS — Z79890 Hormone replacement therapy (postmenopausal): Secondary | ICD-10-CM | POA: Diagnosis not present

## 2023-06-15 DIAGNOSIS — N951 Menopausal and female climacteric states: Secondary | ICD-10-CM | POA: Diagnosis not present

## 2023-06-15 DIAGNOSIS — Z6830 Body mass index (BMI) 30.0-30.9, adult: Secondary | ICD-10-CM | POA: Diagnosis not present

## 2023-06-15 DIAGNOSIS — G479 Sleep disorder, unspecified: Secondary | ICD-10-CM | POA: Diagnosis not present

## 2023-06-15 MED ORDER — TRIAMCINOLONE ACETONIDE 55 MCG/ACT NA AERO
INHALATION_SPRAY | NASAL | 11 refills | Status: DC
  Filled 2023-06-15 (×2): qty 16.9, 30d supply, fill #0
  Filled 2023-10-19: qty 16.9, 30d supply, fill #1
  Filled 2024-01-03: qty 16.9, 30d supply, fill #2
  Filled 2024-02-29: qty 16.9, 30d supply, fill #3
  Filled 2024-06-06: qty 16.9, 30d supply, fill #4

## 2023-07-20 ENCOUNTER — Ambulatory Visit: Payer: 59

## 2023-07-28 ENCOUNTER — Ambulatory Visit
Admission: EM | Admit: 2023-07-28 | Discharge: 2023-07-28 | Disposition: A | Discharge status: Home / Self Care | Payer: 59 | Attending: Emergency Medicine | Admitting: Emergency Medicine

## 2023-07-28 DIAGNOSIS — J340 Abscess, furuncle and carbuncle of nose: Secondary | ICD-10-CM

## 2023-07-28 MED ORDER — DOXYCYCLINE HYCLATE 100 MG PO CAPS: 100.0000 mg | ORAL_CAPSULE | Freq: Two times a day (BID) | ORAL | 0 refills | Status: DC

## 2023-07-28 NOTE — ED Provider Notes (Signed)
[name]    CSN: [id number] Arrival date & time: 07/28/23  1707      History   Chief Complaint Chief Complaint  Patient presents with   Abscess    HPI Heather Pratt is a 52 y.o. female.   Presents for evaluation of an abscess to the right nose beginning 6 days ago.  While was in shower last night site began to drain, scabbing intermittently.  Has increased in size and become painful.  Has been holding warm compresses over the affected area.  Has not occurred before.  Denies presence of fever.  Denies injury or trauma.  Past Medical History:  Diagnosis Date   Allergy    Anxiety    Depression    HLD (hyperlipidemia)    Joint pain    Migraines     Patient Active Problem List   Diagnosis Date Noted   Hyperlipidemia 05/13/2023   Durable power of attorney for healthcare 05/11/2023   Living will in place 05/11/2023   Digital mucinous cyst of finger 05/11/2023   Anxiety associated with depression 11/26/2018   Spasm of muscle of lower back 09/16/2018   Major depressive disorder with current active episode 10/01/2017   Obesity (BMI 30.0-34.9) 10/01/2017   Fatigue 10/01/2017   Encounter for preventive health examination 10/01/2017    Past Surgical History:  Procedure Laterality Date   CESAREAN SECTION WITH BILATERAL TUBAL LIGATION  005   [place]   ENDOMETRIAL ABLATION  2011   menorrhagia,  [place]    OB History     Gravida  2   Para  0   Term  0   Preterm  0   AB  0   Living         SAB  0   IAB  0   Ectopic  0   Multiple      Live Births               Home Medications    Prior to Admission medications   Medication Sig Start Date End Date Taking? Authorizing Provider  doxycycline (VIBRAMYCIN) 100 MG capsule Take 1 capsule (100 mg total) by mouth 2 (two) times daily. 07/28/23  Yes Valinda Hoar, NP  aspirin-acetaminophen-caffeine (EXCEDRIN MIGRAINE) [phone] MG tablet Take 1 tablet by mouth every 6 (six) hours as  needed for headache.    [provider]  buPROPion (WELLBUTRIN XL) 300 MG 24 hr tablet Take 1 tablet (300 mg total) by mouth daily. 05/11/23   Sherlene Shams, MD  cetirizine (ZYRTEC) 10 MG tablet Take 10 mg by mouth daily as needed for allergies.    [provider]  COVID-19 mRNA vaccine [phone] (COMIRNATY) syringe Inject into the muscle. 09/27/22   Judyann Munson, MD  DHEA 25 MG tablet Take 25 mg by mouth daily. 01/25/22   [provider]  ibuprofen (ADVIL,MOTRIN) 200 MG tablet Take 200 mg by mouth every 6 (six) hours as needed.    [provider]  loratadine (CLARITIN) 10 MG tablet Take 10 mg by mouth daily as needed for allergies.    [provider]  Magnesium Glycinate 100 MG CAPS Take 2 capsules by mouth daily. 01/25/22   [provider]  melatonin 5 MG TABS Take 5 mg by mouth at bedtime as needed. 05/29/22   [provider]  predniSONE (STERAPRED UNI-PAK 21 TAB) 10 MG (21) TBPK tablet Take by mouth daily. Take 6 tabs by mouth daily for 1 day,  then 5 tabs for 1 day, then 4 tabs for 1 day, then 3 tabs for 1 day, then 2 tabs for 1 day, then 1 tab by mouth daily for 1 day 05/24/23   Immordino, Jeannett Senior, FNP  traZODone (DESYREL) 50 MG tablet Take 50 mg by mouth at bedtime as needed. 05/06/21   [provider]  triamcinolone (NASACORT ALLERGY 24HR) 55 MCG/ACT AERO nasal inhaler Instill 2 sprays in each nare twice daily 06/14/23   Gigi Gin, [place]  Vitamin D-Vitamin K (VITAMIN K2-VITAMIN D3 PO) Take 1 tablet by mouth daily. 01/25/22   [provider]  Zoster Vaccine Adjuvanted [facility]) injection Inject into the muscle. 08/11/22   Judyann Munson, MD    Family History Family History  Problem Relation Age of Onset   Hypertension Mother    Hyperlipidemia Mother    Thyroid disease Mother    Alcohol abuse Father    Hypertension Father    Hyperlipidemia Father    Depression Father    Eating disorder Father    Obesity  Father    Hypertension Maternal Grandmother    Heart disease Maternal Grandmother    Prostate cancer Maternal Grandfather    Heart disease Maternal Grandfather    Hypertension Maternal Grandfather     Social History Social History   Tobacco Use   Smoking status: Never   Smokeless tobacco: Never  Vaping Use   Vaping status: Never Used  Substance Use Topics   Alcohol use: Yes    Alcohol/week: 2.0 standard drinks of alcohol    Types: 1 Cans of beer, 1 Glasses of wine per week   Drug use: No     Allergies   Macrobid [nitrofurantoin]   Review of Systems Review of Systems   Physical Exam Triage Vital Signs ED Triage Vitals  Encounter Vitals Group     BP 07/28/23 1751 126/69     Systolic BP Percentile --      Diastolic BP Percentile --      Pulse Rate 07/28/23 1751 64     Resp 07/28/23 1751 16     Temp 07/28/23 1751 97.6 F (36.4 C)     Temp Source 07/28/23 1751 Oral     SpO2 07/28/23 1751 97 %     Weight --      Height --      Head Circumference --      Peak Flow --      Pain Score 07/28/23 1800 5     Pain Loc --      Pain Education --      Exclude from Growth Chart --    No data found.  Updated Vital Signs BP 126/69 (BP Location: Left Arm)   Pulse 64   Temp 97.6 F (36.4 C) (Oral)   Resp 16   LMP 04/04/2022 (Exact Date)   SpO2 97%   Visual Acuity Right Eye Distance:   Left Eye Distance:   Bilateral Distance:    Right Eye Near:   Left Eye Near:    Bilateral Near:     Physical Exam Constitutional:      Appearance: Normal appearance.  HENT:     Nose:     Comments: Erythematous abscess present to the right nostril, scabbing present to the opening of the near, tender to palpation, unable to expel drainage Eyes:     Extraocular Movements: Extraocular movements intact.  Pulmonary:     Effort: Pulmonary effort is normal.  Neurological:     Mental  Status: She is alert and oriented to person, place, and time.      UC Treatments / Results   Labs (all labs ordered are listed, but only abnormal results are displayed) Labs Reviewed - No data to display  EKG   Radiology No results found.  Procedures Procedures (including critical care time)  Medications Ordered in UC Medications - No data to display  Initial Impression / Assessment and Plan / UC Course  I have reviewed the triage vital signs and the nursing notes.  Pertinent labs & imaging results that were available during my care of the patient were reviewed by me and considered in my medical decision making (see chart for details).  Abscess of nose  Has already begun to drain spontaneously, will defer I&D at this time, discussed with patient, prescribed doxycycline and recommended continued warm compresses to the affected area as well as Tylenol and Motrin for management of pain, given strict return precautions for nonhealing nondraining site Final Clinical Impressions(s) / UC Diagnoses   Final diagnoses:  Abscess of nose     Discharge Instructions      Take doxycycline every morning and every evening for 7 days  Hold warm-hot compresses to affected area at least 4 times a day, this helps to facilitate draining, the more the better  Continue use of Tylenol and Motrin for pain  Please return for evaluation for increased swelling, increased tenderness or pain, non healing site, non draining site, you begin to have fever or chills   We reviewed the etiology of recurrent abscesses of skin.  Skin abscesses are collections of pus within the dermis and deeper skin tissues. Skin abscesses manifest as painful, tender, fluctuant, and erythematous nodules, frequently surmounted by a pustule and surrounded by a rim of erythematous swelling.  Spontaneous drainage of purulent material may occur.  Fever can occur on occasion.    -Skin abscesses can develop in healthy individuals with no predisposing conditions other than skin or nasal carriage of Staphylococcus aureus.   Individuals in close contact with others who have active infection with skin abscesses are at increased risk which is likely to explain why twin brother has similar episodes.   In addition, any process leading to a breach in the skin barrier can also predispose to the development of a skin abscesses, such as atopic dermatitis.      ED Prescriptions     Medication Sig Dispense Auth. Provider   doxycycline (VIBRAMYCIN) 100 MG capsule Take 1 capsule (100 mg total) by mouth 2 (two) times daily. 14 capsule Heather Pratt, Elita Boone, NP      PDMP not reviewed this encounter.   Valinda Hoar, [place] 07/28/23 [phone]

## 2023-07-28 NOTE — ED Triage Notes (Signed)
Patient to Urgent Care with complaints of an abscess inside her right nostril.  Reports symptoms started last Friday. Increased in size. Now having pain in her sinuses/ teeth.   Using a warm compress. Reports last night it did start draining.

## 2023-07-28 NOTE — Discharge Instructions (Signed)
Take doxycycline every morning and every evening for 7 days  Hold warm-hot compresses to affected area at least 4 times a day, this helps to facilitate draining, the more the better  Continue use of Tylenol and Motrin for pain  Please return for evaluation for increased swelling, increased tenderness or pain, non healing site, non draining site, you begin to have fever or chills   We reviewed the etiology of recurrent abscesses of skin.  Skin abscesses are collections of pus within the dermis and deeper skin tissues. Skin abscesses manifest as painful, tender, fluctuant, and erythematous nodules, frequently surmounted by a pustule and surrounded by a rim of erythematous swelling.  Spontaneous drainage of purulent material may occur.  Fever can occur on occasion.    -Skin abscesses can develop in healthy individuals with no predisposing conditions other than skin or nasal carriage of Staphylococcus aureus.  Individuals in close contact with others who have active infection with skin abscesses are at increased risk which is likely to explain why twin brother has similar episodes.   In addition, any process leading to a breach in the skin barrier can also predispose to the development of a skin abscesses, such as atopic dermatitis.

## 2023-08-25 ENCOUNTER — Ambulatory Visit (INDEPENDENT_AMBULATORY_CARE_PROVIDER_SITE_OTHER): Payer: 59 | Admitting: Dermatology

## 2023-08-25 ENCOUNTER — Encounter: Payer: Self-pay | Admitting: Dermatology

## 2023-08-25 VITALS — BP 123/73 | HR 65

## 2023-08-25 DIAGNOSIS — W908XXA Exposure to other nonionizing radiation, initial encounter: Secondary | ICD-10-CM

## 2023-08-25 DIAGNOSIS — L578 Other skin changes due to chronic exposure to nonionizing radiation: Secondary | ICD-10-CM | POA: Diagnosis not present

## 2023-08-25 DIAGNOSIS — L570 Actinic keratosis: Secondary | ICD-10-CM

## 2023-08-25 NOTE — Progress Notes (Signed)
   Follow-Up Visit   Subjective  Heather Pratt is a 52 y.o. female who presents for the following: Spot on nose. Will not heal. Dur: 4-5 months. Denies itching, bleeding.   The patient has spots, moles and lesions to be evaluated, some may be new or changing and the patient may have concern these could be cancer.    The following portions of the chart were reviewed this encounter and updated as appropriate: medications, allergies, medical history  Review of Systems:  No other skin or systemic complaints except as noted in HPI or Assessment and Plan.  Objective  Well appearing patient in no apparent distress; mood and affect are within normal limits.  A focused examination was performed of the following areas: Face, nose  Relevant physical exam findings are noted in the Assessment and Plan.  left nasal dorsum x 1 Erythematous thin papules/macules with gritty scale.     Assessment & Plan   AK (actinic keratosis) left nasal dorsum x 1  Actinic keratoses are precancerous spots that appear secondary to cumulative UV radiation exposure/sun exposure over time. They are chronic with expected duration over 1 year. A portion of actinic keratoses will progress to squamous cell carcinoma of the skin. It is not possible to reliably predict which spots will progress to skin cancer and so treatment is recommended to prevent development of skin cancer.  Recommend daily broad spectrum sunscreen SPF 30+ to sun-exposed areas, reapply every 2 hours as needed.  Recommend staying in the shade or wearing long sleeves, sun glasses (UVA+UVB protection) and wide brim hats (4-inch brim around the entire circumference of the hat). Call for new or changing lesions.   Destruction of lesion - left nasal dorsum x 1 Complexity: simple   Destruction method: cryotherapy   Informed consent: discussed and consent obtained   Timeout:  patient name, date of birth, surgical site, and procedure  verified Lesion destroyed using liquid nitrogen: Yes   Region frozen until ice ball extended beyond lesion: Yes   Outcome: patient tolerated procedure well with no complications   Post-procedure details: wound care instructions given     ACTINIC DAMAGE - chronic, secondary to cumulative UV radiation exposure/sun exposure over time - diffuse scaly erythematous macules with underlying dyspigmentation - Recommend daily broad spectrum sunscreen SPF 30+ to sun-exposed areas, reapply every 2 hours as needed.  - Recommend staying in the shade or wearing long sleeves, sun glasses (UVA+UVB protection) and wide brim hats (4-inch brim around the entire circumference of the hat). - Call for new or changing lesions.    Return if symptoms worsen or fail to improve.  IAngelique Holm, CMA, am acting as scribe for Elie Goody, MD .   Documentation: I have reviewed the above documentation for accuracy and completeness, and I agree with the above.  Elie Goody, MD

## 2023-08-25 NOTE — Patient Instructions (Signed)
Cryotherapy Aftercare  Wash gently with soap and water everyday.   Apply Vaseline and Band-Aid daily until healed.      Due to recent changes in healthcare laws, you may see results of your pathology and/or laboratory studies on MyChart before the doctors have had a chance to review them. We understand that in some cases there may be results that are confusing or concerning to you. Please understand that not all results are received at the same time and often the doctors may need to interpret multiple results in order to provide you with the best plan of care or course of treatment. Therefore, we ask that you please give [place] 2 business days to thoroughly review all your results before contacting the office for clarification. Should we see a critical lab result, you will be contacted sooner.   If You Need Anything After Your Visit  If you have any questions or concerns for your doctor, please call our main line at [phone] and press option 4 to reach your doctor's medical assistant. If no one answers, please leave a voicemail as directed and we will return your call as soon as possible. Messages left after 4 pm will be answered the following business day.   You may also send [place] a message via MyChart. We typically respond to MyChart messages within 1-2 business days.  For prescription refills, please ask your pharmacy to contact our office. Our fax number is [phone].  If you have an urgent issue when the clinic is closed that cannot wait until the next business day, you can page your doctor at the number below.    Please note that while we do our best to be available for urgent issues outside of office hours, we are not available 24/7.   If you have an urgent issue and are unable to reach [place], you may choose to seek medical care at your doctor's office, retail clinic, urgent care center, or emergency room.  If you have a medical emergency, please immediately call 911 or go to the  emergency department.  Pager Numbers  - Dr. Gwen Pounds: [phone]  - Dr. Roseanne Reno: [phone]  - Dr. Katrinka Blazing: [phone]   In the event of inclement weather, please call our main line at [phone] for an update on the status of any delays or closures.  Dermatology Medication Tips: Please keep the boxes that topical medications come in in order to help keep track of the instructions about where and how to use these. Pharmacies typically print the medication instructions only on the boxes and not directly on the medication tubes.   If your medication is too expensive, please contact our office at [phone] option 4 or send [place] a message through MyChart.   We are unable to tell what your co-pay for medications will be in advance as this is different depending on your insurance coverage. However, we may be able to find a substitute medication at lower cost or fill out paperwork to get insurance to cover a needed medication.   If a prior authorization is required to get your medication covered by your insurance company, please allow [place] 1-2 business days to complete this process.  Drug prices often vary depending on where the prescription is filled and some pharmacies may offer cheaper prices.  The website www.goodrx.com contains coupons for medications through different pharmacies. The prices here do not account for what the cost may be with help from insurance (it may be cheaper with your insurance), but the  website can give you the price if you did not use any insurance.  - You can print the associated coupon and take it with your prescription to the pharmacy.  - You may also stop by our office during regular business hours and pick up a GoodRx coupon card.  - If you need your prescription sent electronically to a different pharmacy, notify our office through [employer] or by phone at [phone] option 4.     Si Usted Necesita Algo Despus de Su Visita  Tambin puede  enviarnos un mensaje a travs de Clinical cytogeneticist. Por lo general respondemos a los mensajes de MyChart en el transcurso de 1 a 2 das hbiles.  Para renovar recetas, por favor pida a su farmacia que se ponga en contacto con nuestra oficina. Annie Sable de fax es [place] [phone].  Si tiene un asunto urgente cuando la clnica est cerrada y que no puede esperar hasta el siguiente da hbil, puede llamar/localizar a su doctor(a) al nmero que aparece a continuacin.   Por favor, tenga en cuenta que aunque hacemos todo lo posible para estar disponibles para asuntos urgentes fuera del horario de [place], no estamos disponibles las 24 horas del da, los 7 [address]  [address] de la [place].   Si tiene un problema urgente y no puede comunicarse con nosotros, puede optar por buscar atencin mdica  en el consultorio de su doctor(a), en una clnica privada, en un centro de atencin urgente o en una sala de emergencias.  Si tiene Engineer, drilling, por favor llame inmediatamente al 911 o vaya a la sala de emergencias.  Nmeros de bper  - Dr. Gwen Pounds: [phone]  - Dra. Roseanne Reno: [phone]  - Dr. Katrinka Blazing: [phone]   En caso de inclemencias del tiempo, por favor llame a Lacy Duverney principal al [phone] para una actualizacin sobre el [place] de cualquier retraso o cierre.  Consejos para la medicacin en dermatologa: Por favor, guarde las cajas en las que vienen los medicamentos de uso tpico para ayudarle a seguir las instrucciones sobre dnde y cmo usarlos. Las farmacias generalmente imprimen las instrucciones del medicamento slo en las cajas y no directamente en los tubos del [place].   Si su medicamento es muy caro, por favor, pngase en contacto con Rolm Gala llamando al [phone] y presione la opcin 4 o envenos un mensaje a travs de Clinical cytogeneticist.   No podemos decirle cul ser su copago por los medicamentos por adelantado ya que esto es diferente dependiendo de la cobertura de su  seguro. Sin embargo, es posible que podamos encontrar un medicamento sustituto a Audiological scientist un formulario para que el seguro cubra el medicamento que se considera necesario.   Si se requiere una autorizacin previa para que su compaa de seguros [place] su medicamento, por favor permtanos de 1 a 2 das hbiles para completar [address]  Los precios de los medicamentos varan con frecuencia dependiendo del Environmental consultant de dnde se surte la receta y alguna farmacias pueden ofrecer precios ms baratos.  El sitio web www.goodrx.com tiene cupones para medicamentos de Health and safety inspector. Los precios aqu no tienen en cuenta lo que podra costar con la ayuda del seguro (puede ser ms barato con su seguro), pero el sitio web puede darle el precio si no utiliz Tourist information centre manager.  - Puede imprimir el cupn correspondiente y llevarlo con su receta a la farmacia.  - Tambin puede pasar por nuestra oficina durante el horario de atencin regular y Education officer, museum una tarjeta de cupones de  GoodRx.  - Si necesita que su receta se enve electrnicamente a una farmacia diferente, informe a nuestra oficina a travs de MyChart de [place] o por telfono llamando al [phone] y presione la opcin 4.

## 2023-09-02 ENCOUNTER — Other Ambulatory Visit (HOSPITAL_COMMUNITY): Payer: Self-pay

## 2023-09-02 MED ORDER — COMIRNATY 30 MCG/0.3ML IM SUSY
0.3000 mL | PREFILLED_SYRINGE | Freq: Once | INTRAMUSCULAR | 0 refills | Status: AC
  Filled 2023-09-02: qty 0.3, 1d supply, fill #0

## 2023-09-02 MED ORDER — INFLUENZA VIRUS VACC SPLIT PF (FLUZONE) 0.5 ML IM SUSY
0.5000 mL | PREFILLED_SYRINGE | Freq: Once | INTRAMUSCULAR | 0 refills | Status: AC
  Filled 2023-09-02: qty 0.5, 1d supply, fill #0

## 2023-09-13 ENCOUNTER — Ambulatory Visit
Admission: RE | Admit: 2023-09-13 | Discharge: 2023-09-13 | Disposition: A | Discharge status: Home / Self Care | Payer: 59 | Source: Ambulatory Visit | Attending: Internal Medicine | Admitting: Internal Medicine

## 2023-09-13 DIAGNOSIS — Z1231 Encounter for screening mammogram for malignant neoplasm of breast: Secondary | ICD-10-CM | POA: Diagnosis not present

## 2023-09-27 ENCOUNTER — Encounter: Payer: Self-pay | Admitting: Dermatology

## 2023-09-27 ENCOUNTER — Ambulatory Visit (INDEPENDENT_AMBULATORY_CARE_PROVIDER_SITE_OTHER): Payer: 59 | Admitting: Dermatology

## 2023-09-27 DIAGNOSIS — D1801 Hemangioma of skin and subcutaneous tissue: Secondary | ICD-10-CM

## 2023-09-27 DIAGNOSIS — Z1283 Encounter for screening for malignant neoplasm of skin: Secondary | ICD-10-CM | POA: Diagnosis not present

## 2023-09-27 DIAGNOSIS — Z872 Personal history of diseases of the skin and subcutaneous tissue: Secondary | ICD-10-CM | POA: Diagnosis not present

## 2023-09-27 DIAGNOSIS — L814 Other melanin hyperpigmentation: Secondary | ICD-10-CM | POA: Diagnosis not present

## 2023-09-27 DIAGNOSIS — D229 Melanocytic nevi, unspecified: Secondary | ICD-10-CM

## 2023-09-27 DIAGNOSIS — L578 Other skin changes due to chronic exposure to nonionizing radiation: Secondary | ICD-10-CM

## 2023-09-27 DIAGNOSIS — L821 Other seborrheic keratosis: Secondary | ICD-10-CM

## 2023-09-27 DIAGNOSIS — L570 Actinic keratosis: Secondary | ICD-10-CM | POA: Diagnosis not present

## 2023-09-27 DIAGNOSIS — W908XXA Exposure to other nonionizing radiation, initial encounter: Secondary | ICD-10-CM

## 2023-09-27 NOTE — Patient Instructions (Signed)

## 2023-09-27 NOTE — Progress Notes (Signed)
Follow-Up Visit   Subjective  Heather Pratt is a 52 y.o. female who presents for the following: Skin Cancer Screening and Full Body Skin Exam  The patient presents for Total-Body Skin Exam (TBSE) for skin cancer screening and mole check. The patient has spots, moles and lesions to be evaluated, some may be new or changing and the patient may have concern these could be cancer.  Hx of AK  The following portions of the chart were reviewed this encounter and updated as appropriate: medications, allergies, medical history  Review of Systems:  No other skin or systemic complaints except as noted in HPI or Assessment and Plan.  Objective  Well appearing patient in no apparent distress; mood and affect are within normal limits.  A full examination was performed including scalp, head, eyes, ears, nose, lips, neck, chest, axillae, abdomen, back, buttocks, bilateral upper extremities, bilateral lower extremities, hands, feet, fingers, toes, fingernails, and toenails. All findings within normal limits unless otherwise noted below.   Relevant physical exam findings are noted in the Assessment and Plan.  L mid forehead x 1 Erythematous thin papules/macules with gritty scale.     Assessment & Plan   SKIN CANCER SCREENING PERFORMED TODAY.  ACTINIC DAMAGE - Chronic condition, secondary to cumulative UV/sun exposure - diffuse scaly erythematous macules with underlying dyspigmentation - Recommend daily broad spectrum sunscreen SPF 30+ to sun-exposed areas, reapply every 2 hours as needed.  - Staying in the shade or wearing long sleeves, sun glasses (UVA+UVB protection) and wide brim hats (4-inch brim around the entire circumference of the hat) are also recommended for sun protection.  - Call for new or changing lesions.  LENTIGINES, SEBORRHEIC KERATOSES, HEMANGIOMAS - Benign normal skin lesions - Benign-appearing - Call for any changes  MELANOCYTIC NEVI - Tan-brown and/or  pink-flesh-colored symmetric macules and papules - Benign appearing on exam today - Observation - Call clinic for new or changing moles - Recommend daily use of broad spectrum spf 30+ sunscreen to sun-exposed areas.   HISTORY OF PRECANCEROUS ACTINIC KERATOSIS - site(s) of PreCancerous Actinic Keratosis clear today. - these may recur and new lesions may form requiring treatment to prevent transformation into skin cancer - observe for new or changing spots and contact [facility] for appointment if occur - photoprotection with sun protective clothing; sunglasses and broad spectrum sunscreen with SPF of at least 30 + and frequent self skin exams recommended - yearly exams by a dermatologist recommended for persons with history of PreCancerous Actinic Keratoses     AK (actinic keratosis) L mid forehead x 1  Actinic keratoses are precancerous spots that appear secondary to cumulative UV radiation exposure/sun exposure over time. They are chronic with expected duration over 1 year. A portion of actinic keratoses will progress to squamous cell carcinoma of the skin. It is not possible to reliably predict which spots will progress to skin cancer and so treatment is recommended to prevent development of skin cancer.  Recommend daily broad spectrum sunscreen SPF 30+ to sun-exposed areas, reapply every 2 hours as needed.  Recommend staying in the shade or wearing long sleeves, sun glasses (UVA+UVB protection) and wide brim hats (4-inch brim around the entire circumference of the hat). Call for new or changing lesions.   Destruction of lesion - L mid forehead x 1 Complexity: simple   Destruction method: cryotherapy   Informed consent: discussed and consent obtained   Timeout:  patient name, date of birth, surgical site, and procedure verified  Lesion destroyed using liquid nitrogen: Yes   Region frozen until ice ball extended beyond lesion: Yes   Cryo cycles: 1 or 2. Outcome: patient  tolerated procedure well with no complications   Post-procedure details: wound care instructions given     Return in about 1 year (around 09/26/2024) for TBSE, Hx AK, with Dr. Katrinka Blazing.  Anise Salvo, RMA, am acting as scribe for Elie Goody, MD .   Documentation: I have reviewed the above documentation for accuracy and completeness, and I agree with the above.  Elie Goody, MD

## 2023-10-19 ENCOUNTER — Encounter: Payer: Self-pay | Admitting: Internal Medicine

## 2023-10-19 ENCOUNTER — Other Ambulatory Visit (HOSPITAL_COMMUNITY): Payer: Self-pay

## 2023-10-19 ENCOUNTER — Other Ambulatory Visit: Payer: Self-pay

## 2023-10-19 DIAGNOSIS — G8929 Other chronic pain: Secondary | ICD-10-CM

## 2023-12-06 ENCOUNTER — Other Ambulatory Visit: Payer: Self-pay | Admitting: Internal Medicine

## 2023-12-06 ENCOUNTER — Other Ambulatory Visit (HOSPITAL_COMMUNITY): Payer: Self-pay

## 2023-12-06 MED ORDER — BUPROPION HCL ER (XL) 300 MG PO TB24
300.0000 mg | ORAL_TABLET | Freq: Every day | ORAL | 1 refills | Status: DC
  Filled 2023-12-06: qty 90, 90d supply, fill #0
  Filled 2024-02-29: qty 90, 90d supply, fill #1

## 2023-12-12 DIAGNOSIS — R5383 Other fatigue: Secondary | ICD-10-CM | POA: Diagnosis not present

## 2023-12-12 DIAGNOSIS — N951 Menopausal and female climacteric states: Secondary | ICD-10-CM | POA: Diagnosis not present

## 2023-12-14 DIAGNOSIS — R5383 Other fatigue: Secondary | ICD-10-CM | POA: Diagnosis not present

## 2023-12-14 DIAGNOSIS — N951 Menopausal and female climacteric states: Secondary | ICD-10-CM | POA: Diagnosis not present

## 2023-12-14 DIAGNOSIS — Z79890 Hormone replacement therapy (postmenopausal): Secondary | ICD-10-CM | POA: Diagnosis not present

## 2023-12-14 DIAGNOSIS — R6882 Decreased libido: Secondary | ICD-10-CM | POA: Diagnosis not present

## 2023-12-14 DIAGNOSIS — Z6830 Body mass index (BMI) 30.0-30.9, adult: Secondary | ICD-10-CM | POA: Diagnosis not present

## 2024-01-03 ENCOUNTER — Other Ambulatory Visit: Payer: Self-pay

## 2024-02-03 ENCOUNTER — Telehealth: Payer: 59 | Admitting: Physician Assistant

## 2024-02-03 DIAGNOSIS — J340 Abscess, furuncle and carbuncle of nose: Secondary | ICD-10-CM

## 2024-02-03 MED ORDER — DOXYCYCLINE HYCLATE 100 MG PO TABS: 100.0000 mg | ORAL_TABLET | Freq: Two times a day (BID) | ORAL | 0 refills | Status: DC

## 2024-02-03 MED ORDER — MUPIROCIN 2 % EX OINT: 1.0000 | TOPICAL_OINTMENT | Freq: Two times a day (BID) | CUTANEOUS | 0 refills | Status: DC

## 2024-02-03 NOTE — Progress Notes (Signed)
 E-Visit for Cellulitis  We are sorry that you are not feeling well. Here is how we plan to help!  Based on what you shared with me it looks like you have cellulitis.  Cellulitis looks like areas of skin redness, swelling, and warmth; it develops as a result of bacteria entering under the skin. Little red spots and/or bleeding can be seen in skin, and tiny surface sacs containing fluid can occur. Fever can be present. Cellulitis is almost always on one side of a body, and the lower limbs are the most common site of involvement.   I have prescribed:  Doxycycline 100mg  Take 1 tablet twice daily for 7 days -AND- Mupirocin ointment Apply topically to affected area twice daily  HOME CARE:  Take your medications as ordered and take all of them, even if the skin irritation appears to be healing.   GET HELP RIGHT AWAY IF:  Symptoms that don't begin to go away within 48 hours. Severe redness persists or worsens If the area turns color, spreads or swells. If it blisters and opens, develops yellow-brown crust or bleeds. You develop a fever or chills. If the pain increases or becomes unbearable.  Are unable to keep fluids and food down.  MAKE SURE YOU   Understand these instructions. Will watch your condition. Will get help right away if you are not doing well or get worse.  Thank you for choosing an e-visit.  Your e-visit answers were reviewed by a board certified advanced clinical practitioner to complete your personal care plan. Depending upon the condition, your plan could have included both over the counter or prescription medications.  Please review your pharmacy choice. Make sure the pharmacy is open so you can pick up prescription now. If there is a problem, you may contact your provider through [employer] and have the prescription routed to another pharmacy.  Your safety is important to [place]. If you have drug allergies check your prescription carefully.   For the next 24 hours you  can use MyChart to ask questions about today's visit, request a non-urgent call back, or ask for a work or school excuse. You will get an email in the next two days asking about your experience. I hope that your e-visit has been valuable and will speed your recovery.   I have spent 5 minutes in review of e-visit questionnaire, review and updating patient chart, medical decision making and response to patient.   Margaretann Loveless, PA-C

## 2024-02-26 ENCOUNTER — Encounter: Payer: Self-pay | Admitting: Internal Medicine

## 2024-02-27 ENCOUNTER — Other Ambulatory Visit: Payer: Self-pay

## 2024-02-27 ENCOUNTER — Other Ambulatory Visit (HOSPITAL_COMMUNITY): Payer: Self-pay

## 2024-02-27 MED ORDER — TRAZODONE HCL 50 MG PO TABS
50.0000 mg | ORAL_TABLET | Freq: Every evening | ORAL | 1 refills | Status: DC | PRN
  Filled 2024-02-27: qty 90, 90d supply, fill #0
  Filled 2024-07-11: qty 90, 90d supply, fill #1

## 2024-02-27 NOTE — Telephone Encounter (Signed)
 Medication is in the med list however it says historical provider. Is it okay to refill?

## 2024-02-28 ENCOUNTER — Other Ambulatory Visit (HOSPITAL_COMMUNITY): Payer: Self-pay

## 2024-02-28 ENCOUNTER — Encounter (HOSPITAL_COMMUNITY): Payer: Self-pay

## 2024-02-29 ENCOUNTER — Other Ambulatory Visit (HOSPITAL_COMMUNITY): Payer: Self-pay

## 2024-03-07 ENCOUNTER — Telehealth: Payer: Self-pay

## 2024-03-07 ENCOUNTER — Encounter: Payer: Self-pay | Admitting: Internal Medicine

## 2024-03-07 NOTE — Telephone Encounter (Signed)
 I left voicemail for patient letting her know that we need to res

## 2024-03-07 NOTE — Telephone Encounter (Signed)
 I left a voicemail for patient letting her know that we need to reschedule her 6/92025 appointment with Dr. Duncan Dull, as provider will not be in office.  I also sent a letter to patient via MyChart.  E2C2 - when patient calls back, please reschedule this appointment.

## 2024-05-14 ENCOUNTER — Encounter: Payer: 59 | Admitting: Internal Medicine

## 2024-06-06 ENCOUNTER — Other Ambulatory Visit (HOSPITAL_COMMUNITY): Payer: Self-pay

## 2024-06-06 ENCOUNTER — Other Ambulatory Visit: Payer: Self-pay

## 2024-06-06 ENCOUNTER — Other Ambulatory Visit: Payer: Self-pay | Admitting: Internal Medicine

## 2024-06-06 MED ORDER — BUPROPION HCL ER (XL) 300 MG PO TB24
300.0000 mg | ORAL_TABLET | Freq: Every day | ORAL | 1 refills | Status: DC
  Filled 2024-06-06: qty 90, 90d supply, fill #0
  Filled 2024-08-30: qty 90, 90d supply, fill #1

## 2024-06-13 DIAGNOSIS — Z01419 Encounter for gynecological examination (general) (routine) without abnormal findings: Secondary | ICD-10-CM | POA: Diagnosis not present

## 2024-06-13 DIAGNOSIS — F419 Anxiety disorder, unspecified: Secondary | ICD-10-CM | POA: Diagnosis not present

## 2024-06-13 DIAGNOSIS — Z780 Asymptomatic menopausal state: Secondary | ICD-10-CM | POA: Diagnosis not present

## 2024-06-19 ENCOUNTER — Other Ambulatory Visit: Payer: Self-pay

## 2024-06-20 ENCOUNTER — Encounter: Admitting: Internal Medicine

## 2024-07-10 ENCOUNTER — Encounter: Admitting: Internal Medicine

## 2024-07-11 ENCOUNTER — Other Ambulatory Visit (HOSPITAL_COMMUNITY): Payer: Self-pay

## 2024-07-19 ENCOUNTER — Other Ambulatory Visit: Payer: Self-pay

## 2024-07-19 ENCOUNTER — Encounter: Payer: Self-pay | Admitting: Internal Medicine

## 2024-07-19 ENCOUNTER — Other Ambulatory Visit (HOSPITAL_COMMUNITY): Payer: Self-pay

## 2024-07-19 ENCOUNTER — Ambulatory Visit: Admitting: Internal Medicine

## 2024-07-19 VITALS — BP 110/68 | HR 76 | Temp 97.5°F | Ht 62.0 in | Wt 164.8 lb

## 2024-07-19 DIAGNOSIS — N951 Menopausal and female climacteric states: Secondary | ICD-10-CM | POA: Diagnosis not present

## 2024-07-19 DIAGNOSIS — E785 Hyperlipidemia, unspecified: Secondary | ICD-10-CM | POA: Diagnosis not present

## 2024-07-19 DIAGNOSIS — Z1211 Encounter for screening for malignant neoplasm of colon: Secondary | ICD-10-CM

## 2024-07-19 DIAGNOSIS — R5383 Other fatigue: Secondary | ICD-10-CM | POA: Diagnosis not present

## 2024-07-19 DIAGNOSIS — R7301 Impaired fasting glucose: Secondary | ICD-10-CM | POA: Diagnosis not present

## 2024-07-19 DIAGNOSIS — F418 Other specified anxiety disorders: Secondary | ICD-10-CM

## 2024-07-19 DIAGNOSIS — F331 Major depressive disorder, recurrent, moderate: Secondary | ICD-10-CM

## 2024-07-19 LAB — CBC WITH DIFFERENTIAL/PLATELET
Basophils Absolute: 0.1 K/uL (ref 0.0–0.1)
Basophils Relative: 1 % (ref 0.0–3.0)
Eosinophils Absolute: 0.2 K/uL (ref 0.0–0.7)
Eosinophils Relative: 3.8 % (ref 0.0–5.0)
HCT: 40.5 % (ref 36.0–46.0)
Hemoglobin: 13.4 g/dL (ref 12.0–15.0)
Lymphocytes Relative: 28.2 % (ref 12.0–46.0)
Lymphs Abs: 1.8 K/uL (ref 0.7–4.0)
MCHC: 33.1 g/dL (ref 30.0–36.0)
MCV: 88.5 fl (ref 78.0–100.0)
Monocytes Absolute: 0.6 K/uL (ref 0.1–1.0)
Monocytes Relative: 9.5 % (ref 3.0–12.0)
Neutro Abs: 3.7 K/uL (ref 1.4–7.7)
Neutrophils Relative %: 57.5 % (ref 43.0–77.0)
Platelets: 262 K/uL (ref 150.0–400.0)
RBC: 4.58 Mil/uL (ref 3.87–5.11)
RDW: 13.7 % (ref 11.5–15.5)
WBC: 6.4 K/uL (ref 4.0–10.5)

## 2024-07-19 LAB — COMPREHENSIVE METABOLIC PANEL WITH GFR
ALT: 15 U/L (ref 0–35)
AST: 17 U/L (ref 0–37)
Albumin: 4.2 g/dL (ref 3.5–5.2)
Alkaline Phosphatase: 64 U/L (ref 39–117)
BUN: 22 mg/dL (ref 6–23)
CO2: 28 meq/L (ref 19–32)
Calcium: 9.3 mg/dL (ref 8.4–10.5)
Chloride: 102 meq/L (ref 96–112)
Creatinine, Ser: 0.74 mg/dL (ref 0.40–1.20)
GFR: 93.1 mL/min (ref >60.00)
Glucose, Bld: 88 mg/dL (ref 70–99)
Potassium: 4.1 meq/L (ref 3.5–5.1)
Sodium: 137 meq/L (ref 135–145)
Total Bilirubin: 0.3 mg/dL (ref 0.2–1.2)
Total Protein: 7.1 g/dL (ref 6.0–8.3)

## 2024-07-19 LAB — LDL CHOLESTEROL, DIRECT: Direct LDL: 165 mg/dL

## 2024-07-19 LAB — LIPID PANEL
Cholesterol: 230 mg/dL — ABNORMAL HIGH (ref 0–200)
HDL: 58.7 mg/dL (ref >39.00)
LDL Cholesterol: 156 mg/dL — ABNORMAL HIGH (ref 0–99)
NonHDL: 171.51
Total CHOL/HDL Ratio: 4
Triglycerides: 80 mg/dL (ref 0.0–149.0)
VLDL: 16 mg/dL (ref 0.0–40.0)

## 2024-07-19 LAB — HEMOGLOBIN A1C: Hgb A1c MFr Bld: 5.9 % (ref 4.6–6.5)

## 2024-07-19 LAB — TSH: TSH: 1.68 u[IU]/mL (ref 0.35–5.50)

## 2024-07-19 MED ORDER — ALPRAZOLAM 0.25 MG PO TABS
0.2500 mg | ORAL_TABLET | Freq: Every evening | ORAL | 2 refills | Status: AC | PRN
  Filled 2024-07-19: qty 30, 30d supply, fill #0

## 2024-07-19 MED ORDER — TRAZODONE HCL 50 MG PO TABS
50.0000 mg | ORAL_TABLET | Freq: Every evening | ORAL | 1 refills | Status: AC | PRN
  Filled 2024-07-19: qty 90, 90d supply, fill #0

## 2024-07-19 MED ORDER — ESCITALOPRAM OXALATE 10 MG PO TABS
10.0000 mg | ORAL_TABLET | Freq: Every day | ORAL | 1 refills | Status: DC
  Filled 2024-07-19: qty 90, 90d supply, fill #0
  Filled 2024-10-13: qty 90, 90d supply, fill #1

## 2024-07-19 NOTE — Progress Notes (Signed)
 Subjective:  Patient ID: Heather Pratt, female    DOB: Oct 01, 1971  Age: 53 y.o. MRN: [id number]  CC: The primary encounter diagnosis was Colon cancer screening. Diagnoses of Fatigue, unspecified type, Hyperlipidemia, unspecified hyperlipidemia type, Impaired fasting glucose, Moderate episode of recurrent major depressive disorder (HCC), Anxiety associated with depression, and Menopause syndrome were also pertinent to this visit.   HPI [name] presents for  Chief Complaint  Patient presents with   Medical Management of Chronic Issues    Follow up on depression   Heather Pratt is a 53 yr old female with a history of mild depression  that has been treated with wellbutrin  300 mg daily and returns for follow up.   she has entered menopause as of Sept 2024 and  has increased insomnia despite using trazodone ,.  SHE falls asleep easily around 9:30  but wakes up at midnight and often lies awake for hours ,  which interferes with her ability to get up at 4:30 to exercise.  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  She worries about her 47 yr old son  who is exhibiting signs of depression :  he is unmotivated, missing his friends who have gone off to college,  not motivated to start college himself , despite having a scholarship,  and refuses to meet with a psychiatrist to treat his depression.  Her son and husband are not communicating currently and are in conflict , but her husband is in therapy. She is also also stressed out due to having to manage mother in law's passive aggressive attention seeking behavior     Outpatient Medications Prior to Visit  Medication Sig Dispense Refill   aspirin-acetaminophen-caffeine (EXCEDRIN MIGRAINE) 250-250-65 MG tablet Take 1 tablet by mouth every 6 (six) hours as needed for headache.     buPROPion  (WELLBUTRIN  XL) 300 MG 24 hr tablet Take 1 tablet  (300 mg total) by mouth daily. 90 tablet 1   cetirizine (ZYRTEC) 10 MG tablet Take 10 mg by mouth daily as needed for allergies.     ibuprofen (ADVIL,MOTRIN) 200 MG tablet Take 200 mg by mouth every 6 (six) hours as needed.     loratadine (CLARITIN) 10 MG tablet Take 10 mg by mouth daily as needed for allergies.     Magnesium Glycinate 100 MG CAPS Take 2 capsules by mouth daily.     melatonin 5 MG TABS Take 5 mg by mouth at bedtime as needed.     triamcinolone  (NASACORT  ALLERGY  24HR) 55 MCG/ACT AERO nasal inhaler Instill 2 sprays in each nare twice daily 16.9 mL 11   Vitamin D -Vitamin K (VITAMIN K2-VITAMIN D3 PO) Take 1 tablet by mouth daily.     traZODone  (DESYREL ) 50 MG tablet Take 1 tablet (50 mg total) by mouth at bedtime as needed. 90 tablet 1   DHEA 25 MG tablet Take 25 mg by mouth daily. (Patient not taking: Reported on 07/19/2024)     doxycycline  (VIBRA -TABS) 100 MG tablet Take 1 tablet (100 mg total) by mouth 2 (two) times daily. (Patient not taking: Reported on 07/19/2024) 14 tablet 0   mupirocin  ointment (BACTROBAN ) 2 % Apply 1 Application topically 2 (two) times daily. (Patient not taking: Reported on 07/19/2024) 22 g 0   COVID-19 mRNA vaccine 2023-2024 (COMIRNATY ) syringe Inject into the muscle. 0.3 mL 0   Zoster Vaccine Adjuvanted (SHINGRIX ) injection Inject  into the muscle. 1 each 1   No facility-administered medications prior to visit.    Review of Systems;  Patient denies headache, fevers, malaise, unintentional weight loss, skin rash, eye pain, sinus congestion and sinus pain, sore throat, dysphagia,  hemoptysis , cough, dyspnea, wheezing, chest pain, palpitations, orthopnea, edema, abdominal pain, nausea, melena, diarrhea, constipation, flank pain, dysuria, hematuria, urinary  Frequency, nocturia, numbness, tingling, seizures,  Focal weakness, Loss of consciousness,  Tremor, and suicidal ideation.      Objective:  BP 110/68 (BP Location: Left Arm, Patient Position: Sitting,  Cuff Size: Normal)   Pulse 76   Temp (!) 97.5 F (36.4 C) (Oral)   Ht 5' 2 (1.575 m)   Wt 164 lb 12.8 oz (74.8 kg)   LMP 04/04/2022 (Exact Date)   SpO2 96%   BMI 30.14 kg/m   BP Readings from Last 3 Encounters:  07/19/24 110/68  08/25/23 123/73  07/28/23 126/69    Wt Readings from Last 3 Encounters:  07/19/24 164 lb 12.8 oz (74.8 kg)  05/11/23 162 lb 3.2 oz (73.6 kg)  05/06/22 162 lb 9.6 oz (73.8 kg)    Physical Exam Vitals reviewed.  Constitutional:      General: She is not in acute distress.    Appearance: Normal appearance. She is normal weight. She is not ill-appearing, toxic-appearing or diaphoretic.  HENT:     Head: Normocephalic.  Eyes:     General: No scleral icterus.       Right eye: No discharge.        Left eye: No discharge.     Conjunctiva/sclera: Conjunctivae normal.  Cardiovascular:     Rate and Rhythm: Normal rate and regular rhythm.     Heart sounds: Normal heart sounds.  Pulmonary:     Effort: Pulmonary effort is normal. No respiratory distress.     Breath sounds: Normal breath sounds.  Musculoskeletal:        General: Normal range of motion.  Skin:    General: Skin is warm and dry.  Neurological:     General: No focal deficit present.     Mental Status: She is alert and oriented to person, place, and time. Mental status is at baseline.  Psychiatric:        Mood and Affect: Mood normal.        Behavior: Behavior normal.        Thought Content: Thought content normal.        Judgment: Judgment normal.     Lab Results  Component Value Date   HGBA1C 5.9 07/19/2024   HGBA1C 5.5 05/11/2023   HGBA1C 5.3 04/28/2021    Lab Results  Component Value Date   CREATININE 0.74 07/19/2024   CREATININE 0.80 05/11/2023   CREATININE 0.83 05/06/2022    Lab Results  Component Value Date   WBC 6.4 07/19/2024   HGB 13.4 07/19/2024   HCT 40.5 07/19/2024   PLT 262.0 07/19/2024   GLUCOSE 88 07/19/2024   CHOL 230 (H) 07/19/2024   TRIG 80.0  07/19/2024   HDL 58.70 07/19/2024   LDLDIRECT 165.0 07/19/2024   LDLCALC 156 (H) 07/19/2024   ALT 15 07/19/2024   AST 17 07/19/2024   NA 137 07/19/2024   K 4.1 07/19/2024   CL 102 07/19/2024   CREATININE 0.74 07/19/2024   BUN 22 07/19/2024   CO2 28 07/19/2024   TSH 1.68 07/19/2024   HGBA1C 5.9 07/19/2024    MM 3D SCREENING MAMMOGRAM BILATERAL BREAST Result Date: 09/15/2023  CLINICAL DATA:  Screening. EXAM: DIGITAL SCREENING BILATERAL MAMMOGRAM WITH TOMOSYNTHESIS AND CAD TECHNIQUE: Bilateral screening digital craniocaudal and mediolateral oblique mammograms were obtained. Bilateral screening digital breast tomosynthesis was performed. The images were evaluated with computer-aided detection. COMPARISON:  Previous exam(s). ACR Breast Density Category b: There are scattered areas of fibroglandular density. FINDINGS: There are no findings suspicious for malignancy. IMPRESSION: No mammographic evidence of malignancy. A result letter of this screening mammogram will be mailed directly to the patient. RECOMMENDATION: Screening mammogram in one year. (Code:SM-B-01Y) BI-RADS CATEGORY  1: Negative. Electronically Signed   By: Toribio Agreste M.D.   On: 09/15/2023 15:23    Assessment & Plan:  .Colon cancer screening -     Cologuard  Fatigue, unspecified type -     TSH -     CBC with Differential/Platelet  Hyperlipidemia, unspecified hyperlipidemia type -     Lipid panel -     LDL cholesterol, direct  Impaired fasting glucose -     Comprehensive metabolic panel with GFR -     Hemoglobin A1c  Moderate episode of recurrent major depressive disorder (HCC) Assessment & Plan: Her symptoms are currenlty aggravated by menopause and unmanaged anxiety.  Adding lexapro starting dose 5 mg    Anxiety associated with depression Assessment & Plan: Managed with wellbutrin .  No changes based on assessment today. She is engaging in relaxing activities (pottery)  as well    Menopause  syndrome Assessment & Plan: Starting lexapro 5 mg    Other orders -     traZODone HCl; Take 1 tablet (50 mg total) by mouth at bedtime as needed.  Dispense: 90 tablet; Refill: 1 -     Escitalopram Oxalate; Take 1 tablet (10 mg total) by mouth daily.  Dispense: 90 tablet; Refill: 1 -     ALPRAZolam; Take 1 tablet (0.25 mg total) by mouth at bedtime as needed for anxiety or sleep.  Dispense: 30 tablet; Refill: 2   I personally spent a total of 40 minutes in the care of the patient today including preparing to see the patient, getting/reviewing separately obtained history, counseling and educating, and documenting clinical information in the EHR. Follow-up: No follow-ups on file.   Verneita LITTIE Kettering, MD

## 2024-07-19 NOTE — Patient Instructions (Addendum)
 Continue wellbutrin   Add lexapro . Please start the Lexapro  (escitalopram ) at 1/2 tablet daily in the evening for the first few days to avoid nausea.  You can increase to a full tablet after 4 days if you havenot developed side effects of nausea.  You can double the dose to 20 mg after 2 weeks of the 10 mg dose if needed  ALPRAZOLAM  CAN BE TAKEN IF YOU WAKE UP AT MIDNIGHT.  START WITH 1/2 TABLET   YOU DON'T NEED THE PNEUMONIA VACCINE UNLESS  YOU DEVELOP DIABETES OR KIDNEY DISEASE

## 2024-07-21 DIAGNOSIS — N951 Menopausal and female climacteric states: Secondary | ICD-10-CM | POA: Insufficient documentation

## 2024-07-21 NOTE — Assessment & Plan Note (Signed)
Managed with wellbutrin .  No changes based on assessment today. She is engaging in relaxing activities (pottery)  as well

## 2024-07-21 NOTE — Assessment & Plan Note (Addendum)
 Her symptoms are currenlty aggravated by menopause and unmanaged anxiety.  Adding lexapro  starting dose 5 mg

## 2024-07-21 NOTE — Assessment & Plan Note (Signed)
 Starting lexapro  5 mg

## 2024-07-23 ENCOUNTER — Ambulatory Visit: Payer: Self-pay | Admitting: Internal Medicine

## 2024-08-03 ENCOUNTER — Other Ambulatory Visit (HOSPITAL_COMMUNITY): Payer: Self-pay

## 2024-08-09 ENCOUNTER — Other Ambulatory Visit (HOSPITAL_COMMUNITY): Payer: Self-pay

## 2024-08-09 DIAGNOSIS — Z1211 Encounter for screening for malignant neoplasm of colon: Secondary | ICD-10-CM | POA: Diagnosis not present

## 2024-08-09 MED ORDER — FLUZONE 0.5 ML IM SUSY
0.5000 mL | PREFILLED_SYRINGE | Freq: Once | INTRAMUSCULAR | 0 refills | Status: AC
  Filled 2024-08-09: qty 0.5, 1d supply, fill #0

## 2024-08-15 ENCOUNTER — Ambulatory Visit (INDEPENDENT_AMBULATORY_CARE_PROVIDER_SITE_OTHER): Admitting: Internal Medicine

## 2024-08-15 ENCOUNTER — Encounter: Payer: Self-pay | Admitting: Internal Medicine

## 2024-08-15 VITALS — BP 122/64 | HR 76 | Ht 62.0 in | Wt 168.0 lb

## 2024-08-15 DIAGNOSIS — F5104 Psychophysiologic insomnia: Secondary | ICD-10-CM | POA: Diagnosis not present

## 2024-08-15 DIAGNOSIS — F331 Major depressive disorder, recurrent, moderate: Secondary | ICD-10-CM

## 2024-08-15 DIAGNOSIS — G4700 Insomnia, unspecified: Secondary | ICD-10-CM | POA: Insufficient documentation

## 2024-08-15 DIAGNOSIS — Z1211 Encounter for screening for malignant neoplasm of colon: Secondary | ICD-10-CM | POA: Diagnosis not present

## 2024-08-15 DIAGNOSIS — Z0000 Encounter for general adult medical examination without abnormal findings: Secondary | ICD-10-CM

## 2024-08-15 DIAGNOSIS — Z1231 Encounter for screening mammogram for malignant neoplasm of breast: Secondary | ICD-10-CM | POA: Diagnosis not present

## 2024-08-15 MED ORDER — BOOSTRIX 5-2.5-18.5 LF-MCG/0.5 IM SUSY
0.5000 mL | PREFILLED_SYRINGE | Freq: Once | INTRAMUSCULAR | 0 refills | Status: AC
  Filled 2024-08-15: qty 0.5, 1d supply, fill #0

## 2024-08-15 MED ORDER — TRIAMCINOLONE ACETONIDE 55 MCG/ACT NA AERO
INHALATION_SPRAY | NASAL | 11 refills | Status: AC
  Filled 2024-08-15: qty 16.9, 30d supply, fill #0
  Filled 2024-10-01: qty 16.9, 30d supply, fill #1
  Filled 2024-11-24: qty 16.9, 30d supply, fill #2
  Filled 2025-01-11: qty 16.9, 30d supply, fill #3

## 2024-08-15 NOTE — Assessment & Plan Note (Signed)
 Her symptoms are currenlty managed with wellbutrin  and lexapro 

## 2024-08-15 NOTE — Assessment & Plan Note (Signed)

## 2024-08-15 NOTE — Assessment & Plan Note (Addendum)
 USING TRAZODONE  most night,s  occasional alprazolam 

## 2024-08-15 NOTE — Progress Notes (Unsigned)
 Patient ID: Heather Pratt, female    DOB: [birth year]  Age: 53 y.o. MRN: [id number]  The patient is here for annual preventive examination and management of other chronic and acute problems.   The risk factors are reflected in the social history.   The roster of all physicians providing medical care to patient - is listed in the Snapshot section of the chart.   Activities of daily living:  The patient is 100% independent in all ADLs: dressing, toileting, feeding as well as independent mobility   Home safety : The patient has smoke detectors in the home. They wear seatbelts.  There are no unsecured firearms at home. There is no violence in the home.    There is no risks for hepatitis, STDs or HIV. There is no   history of blood transfusion. They have no travel history to infectious disease endemic areas of the world.   The patient has seen their dentist in the last six month. They have seen their eye doctor in the last year. The patinet  denies slight hearing difficulty with regard to whispered voices and some television programs.  They have deferred audiologic testing in the last year.  They do not  have excessive sun exposure. Discussed the need for sun protection: hats, long sleeves and use of sunscreen if there is significant sun exposure.    Diet: the importance of a healthy diet is discussed. They do have a healthy diet.   The benefits of regular aerobic exercise were discussed. The patient  exercises  3 to 5 days per week  for  60 minutes.    Depression screen: there are no signs or vegative symptoms of depression- irritability, change in appetite, anhedonia, sadness/tearfullness.   The following portions of the patient's history were reviewed and updated as appropriate: allergies, current medications, past family history, past medical history,  past surgical history, past social history  and problem list.   Visual acuity was not assessed per patient preference since the patient  has regular follow up with an  ophthalmologist. Hearing and body mass index were assessed and reviewed.    During the course of the visit the patient was educated and counseled about appropriate screening and preventive services including : fall prevention , diabetes screening, nutrition counseling, colorectal cancer screening, and recommended immunizations.    Chief Complaint:  No abnormal PAPs  sees Cousins,    Review of Symptoms  Patient denies headache, fevers, malaise, unintentional weight loss, skin rash, eye pain, sinus congestion and sinus pain, sore throat, dysphagia,  hemoptysis , cough, dyspnea, wheezing, chest pain, palpitations, orthopnea, edema, abdominal pain, nausea, melena, diarrhea, constipation, flank pain, dysuria, hematuria, urinary  Frequency, nocturia, numbness, tingling, seizures,  Focal weakness, Loss of consciousness,  Tremor, insomnia, depression, anxiety, and suicidal ideation.    Physical Exam:  BP 122/64   Pulse 76   Ht 5' 2 (1.575 m)   Wt 168 lb (76.2 kg)   LMP 04/04/2022 (Exact Date)   SpO2 97%   BMI 30.73 kg/m    Physical Exam Vitals reviewed.  Constitutional:      General: She is not in acute distress.    Appearance: Normal appearance. She is normal weight. She is not ill-appearing, toxic-appearing or diaphoretic.  HENT:     Head: Normocephalic.  Eyes:     General: No scleral icterus.       Right eye: No discharge.        Left eye: No discharge.  Conjunctiva/sclera: Conjunctivae normal.  Cardiovascular:     Rate and Rhythm: Normal rate and regular rhythm.     Heart sounds: Normal heart sounds.  Pulmonary:     Effort: Pulmonary effort is normal. No respiratory distress.     Breath sounds: Normal breath sounds.  Musculoskeletal:        General: Normal range of motion.  Skin:    General: Skin is warm and dry.  Neurological:     General: No focal deficit present.     Mental Status: She is alert and oriented to person, place, and time.  Mental status is at baseline.  Psychiatric:        Mood and Affect: Mood normal.        Behavior: Behavior normal.        Thought Content: Thought content normal.        Judgment: Judgment normal.     Assessment and Plan: Colon cancer screening  Psychophysiological insomnia Assessment & Plan: USING TRAZODONE  most night,s  occasional alprazolam     Encounter for preventive health examination Assessment & Plan: age appropriate education and counseling updated, referrals for preventative services and immunizations addressed, dietary and smoking counseling addressed, most recent labs reviewed.  I have personally reviewed and have noted:   1) the patient's medical and social history 2) The pt's use of alcohol, tobacco, and illicit drugs 3) The patient's current medications and supplements 4) Functional ability including ADL's, fall risk, home safety risk, hearing and visual impairment 5) Diet and physical activities 6) Evidence for depression or mood disorder 7) The patient's height, weight, and BMI have been recorded in the chart   I have made referrals, and provided counseling and education based on review of the above    Moderate episode of recurrent major depressive disorder (HCC) Assessment & Plan: Her symptoms are currenlty managed with wellbutrin  and lexapro      Encounter for screening mammogram for malignant neoplasm of breast -     3D Screening Mammogram, Left and Right; Future  Other orders -     Triamcinolone  Acetonide; Instill 2 sprays in each nare twice daily  Dispense: 16.9 mL; Refill: 11 -     Boostrix ; Inject 0.5 mLs into the muscle once for 1 dose.  Dispense: 0.5 mL; Refill: 0    No follow-ups on file.  Verneita LITTIE Kettering, MD

## 2024-08-15 NOTE — Patient Instructions (Signed)
  You are due for your tetanus-diptheria-pertussis vaccine   (TDaP)    in October.  I sent the rx to your pharmacy

## 2024-08-16 ENCOUNTER — Other Ambulatory Visit: Payer: Self-pay

## 2024-08-16 ENCOUNTER — Other Ambulatory Visit (HOSPITAL_COMMUNITY): Payer: Self-pay

## 2024-08-16 LAB — COLOGUARD: COLOGUARD: NEGATIVE

## 2024-08-17 ENCOUNTER — Other Ambulatory Visit (HOSPITAL_COMMUNITY): Payer: Self-pay

## 2024-08-20 ENCOUNTER — Other Ambulatory Visit (HOSPITAL_COMMUNITY): Payer: Self-pay

## 2024-08-21 ENCOUNTER — Other Ambulatory Visit (HOSPITAL_COMMUNITY): Payer: Self-pay

## 2024-08-22 ENCOUNTER — Other Ambulatory Visit (HOSPITAL_COMMUNITY): Payer: Self-pay

## 2024-08-30 ENCOUNTER — Other Ambulatory Visit (HOSPITAL_COMMUNITY): Payer: Self-pay

## 2024-09-07 ENCOUNTER — Encounter: Admitting: Internal Medicine

## 2024-09-14 ENCOUNTER — Ambulatory Visit

## 2024-09-19 ENCOUNTER — Ambulatory Visit

## 2024-09-19 ENCOUNTER — Ambulatory Visit
Admission: RE | Admit: 2024-09-19 | Discharge: 2024-09-19 | Disposition: A | Source: Ambulatory Visit | Attending: Internal Medicine | Admitting: Internal Medicine

## 2024-09-19 DIAGNOSIS — Z1231 Encounter for screening mammogram for malignant neoplasm of breast: Secondary | ICD-10-CM

## 2024-09-24 ENCOUNTER — Other Ambulatory Visit (HOSPITAL_COMMUNITY): Payer: Self-pay

## 2024-09-27 ENCOUNTER — Encounter: Payer: 59 | Admitting: Dermatology

## 2024-10-01 ENCOUNTER — Other Ambulatory Visit: Payer: Self-pay

## 2024-10-01 ENCOUNTER — Other Ambulatory Visit (HOSPITAL_COMMUNITY): Payer: Self-pay

## 2024-10-11 ENCOUNTER — Encounter: Payer: Self-pay | Admitting: Dermatology

## 2024-10-11 ENCOUNTER — Ambulatory Visit (INDEPENDENT_AMBULATORY_CARE_PROVIDER_SITE_OTHER): Admitting: Dermatology

## 2024-10-11 DIAGNOSIS — D229 Melanocytic nevi, unspecified: Secondary | ICD-10-CM

## 2024-10-11 DIAGNOSIS — L814 Other melanin hyperpigmentation: Secondary | ICD-10-CM

## 2024-10-11 DIAGNOSIS — D1801 Hemangioma of skin and subcutaneous tissue: Secondary | ICD-10-CM | POA: Diagnosis not present

## 2024-10-11 DIAGNOSIS — L578 Other skin changes due to chronic exposure to nonionizing radiation: Secondary | ICD-10-CM

## 2024-10-11 DIAGNOSIS — W908XXA Exposure to other nonionizing radiation, initial encounter: Secondary | ICD-10-CM | POA: Diagnosis not present

## 2024-10-11 DIAGNOSIS — Z872 Personal history of diseases of the skin and subcutaneous tissue: Secondary | ICD-10-CM | POA: Diagnosis not present

## 2024-10-11 DIAGNOSIS — L821 Other seborrheic keratosis: Secondary | ICD-10-CM | POA: Diagnosis not present

## 2024-10-11 DIAGNOSIS — Z1283 Encounter for screening for malignant neoplasm of skin: Secondary | ICD-10-CM | POA: Diagnosis not present

## 2024-10-11 NOTE — Patient Instructions (Signed)

## 2024-10-11 NOTE — Progress Notes (Signed)
   Follow-Up Visit   Subjective  Heather Pratt is a 53 y.o. female who presents for the following: Skin Cancer Screening and Full Body Skin Exam, hx of Aks, father with hx of skin cancer not sure type  The patient presents for Total-Body Skin Exam (TBSE) for skin cancer screening and mole check. The patient has spots, moles and lesions to be evaluated, some may be new or changing and the patient may have concern these could be cancer.  The following portions of the chart were reviewed this encounter and updated as appropriate: medications, allergies, medical history  Review of Systems:  No other skin or systemic complaints except as noted in HPI or Assessment and Plan.  Objective  Well appearing patient in no apparent distress; mood and affect are within normal limits.  A full examination was performed including scalp, head, eyes, ears, nose, lips, neck, chest, axillae, abdomen, back, buttocks, bilateral upper extremities, bilateral lower extremities, hands, feet, fingers, toes, fingernails, and toenails. All findings within normal limits unless otherwise noted below.   Exam of toenails limited by presence of nail polish.   Relevant physical exam findings are noted in the Assessment and Plan.    Assessment & Plan   SKIN CANCER SCREENING PERFORMED TODAY.  ACTINIC DAMAGE - Chronic condition, secondary to cumulative UV/sun exposure - diffuse scaly erythematous macules with underlying dyspigmentation - Recommend daily broad spectrum sunscreen SPF 30+ to sun-exposed areas, reapply every 2 hours as needed.  - Staying in the shade or wearing long sleeves, sun glasses (UVA+UVB protection) and wide brim hats (4-inch brim around the entire circumference of the hat) are also recommended for sun protection.  - Call for new or changing lesions.  LENTIGINES, SEBORRHEIC KERATOSES, HEMANGIOMAS - Benign normal skin lesions - Benign-appearing - Call for any changes  MELANOCYTIC NEVI -  Tan-brown and/or pink-flesh-colored symmetric macules and papules - Benign appearing on exam today - Observation - Call clinic for new or changing moles - Recommend daily use of broad spectrum spf 30+ sunscreen to sun-exposed areas.   HISTORY OF PRECANCEROUS ACTINIC KERATOSIS - site(s) of PreCancerous Actinic Keratosis clear today. - these may recur and new lesions may form requiring treatment to prevent transformation into skin cancer - observe for new or changing spots and contact [facility] for appointment if occur - photoprotection with sun protective clothing; sunglasses and broad spectrum sunscreen with SPF of at least 30 + and frequent self skin exams recommended - yearly exams by a dermatologist recommended for persons with history of PreCancerous Actinic Keratoses   MULTIPLE BENIGN NEVI   LENTIGINES   ACTINIC ELASTOSIS   CHERRY ANGIOMA   SEBORRHEIC KERATOSES   Return in about 1 year (around 10/11/2025) for TBSE, Hx of AKs.  I, Grayce Saunas, RMA, am acting as scribe for Boneta Sharps, MD .   Documentation: I have reviewed the above documentation for accuracy and completeness, and I agree with the above.  Boneta Sharps, MD

## 2024-10-14 ENCOUNTER — Other Ambulatory Visit (HOSPITAL_COMMUNITY): Payer: Self-pay

## 2024-11-25 ENCOUNTER — Other Ambulatory Visit (HOSPITAL_COMMUNITY): Payer: Self-pay

## 2024-11-27 ENCOUNTER — Other Ambulatory Visit: Payer: Self-pay | Admitting: Internal Medicine

## 2024-11-28 ENCOUNTER — Other Ambulatory Visit: Payer: Self-pay

## 2024-11-28 ENCOUNTER — Other Ambulatory Visit (HOSPITAL_BASED_OUTPATIENT_CLINIC_OR_DEPARTMENT_OTHER): Payer: Self-pay

## 2024-11-28 ENCOUNTER — Other Ambulatory Visit (HOSPITAL_COMMUNITY): Payer: Self-pay

## 2024-11-28 MED ORDER — BUPROPION HCL ER (XL) 300 MG PO TB24
300.0000 mg | ORAL_TABLET | Freq: Every day | ORAL | 3 refills | Status: AC
  Filled 2024-11-28: qty 90, 90d supply, fill #0

## 2024-11-28 MED ORDER — ESCITALOPRAM OXALATE 10 MG PO TABS
10.0000 mg | ORAL_TABLET | Freq: Every day | ORAL | 3 refills | Status: AC
  Filled 2024-11-28: qty 90, 90d supply, fill #0

## 2024-12-03 ENCOUNTER — Other Ambulatory Visit (HOSPITAL_COMMUNITY): Payer: Self-pay

## 2024-12-03 ENCOUNTER — Encounter (HOSPITAL_COMMUNITY): Payer: Self-pay

## 2024-12-04 ENCOUNTER — Other Ambulatory Visit (HOSPITAL_COMMUNITY): Payer: Self-pay

## 2024-12-05 ENCOUNTER — Other Ambulatory Visit (HOSPITAL_COMMUNITY): Payer: Self-pay

## 2025-01-11 ENCOUNTER — Other Ambulatory Visit (HOSPITAL_COMMUNITY): Payer: Self-pay

## 2025-08-16 ENCOUNTER — Encounter: Admitting: Internal Medicine

## 2025-10-17 ENCOUNTER — Ambulatory Visit: Admitting: Dermatology
